# Patient Record
Sex: Female | Born: 1970 | Race: White | Hispanic: No | Marital: Married | Smoking: Never smoker
Health system: Southern US, Community
[De-identification: ages and names within clinical notes are randomized; demographics above are authoritative.]

## PROBLEM LIST (undated history)

## (undated) DIAGNOSIS — E119 Type 2 diabetes mellitus without complications: Secondary | ICD-10-CM

## (undated) HISTORY — PX: TUBAL LIGATION: SHX77

## (undated) HISTORY — PX: ENDOMETRIAL BIOPSY: SHX622

---

## 2008-11-24 ENCOUNTER — Emergency Department: Payer: Self-pay | Admitting: Emergency Medicine

## 2009-06-14 ENCOUNTER — Emergency Department: Payer: Self-pay | Admitting: Emergency Medicine

## 2010-07-18 ENCOUNTER — Emergency Department: Payer: Self-pay | Admitting: Unknown Physician Specialty

## 2011-07-11 ENCOUNTER — Emergency Department: Payer: Self-pay | Admitting: Unknown Physician Specialty

## 2011-07-11 LAB — URINALYSIS, COMPLETE
Blood: NEGATIVE
Glucose,UR: NEGATIVE mg/dL (ref 0–75)
Leukocyte Esterase: NEGATIVE
Protein: NEGATIVE
RBC,UR: 1 /HPF (ref 0–5)
Specific Gravity: 1.019 (ref 1.003–1.030)
WBC UR: 2 /HPF (ref 0–5)

## 2011-07-11 LAB — CBC
HGB: 12.6 g/dL (ref 12.0–16.0)
MCH: 29.7 pg (ref 26.0–34.0)
RBC: 4.26 10*6/uL (ref 3.80–5.20)
RDW: 12 % (ref 11.5–14.5)
WBC: 9.8 10*3/uL (ref 3.6–11.0)

## 2011-07-11 LAB — COMPREHENSIVE METABOLIC PANEL
Albumin: 3.7 g/dL (ref 3.4–5.0)
Anion Gap: 7 (ref 7–16)
BUN: 12 mg/dL (ref 7–18)
Calcium, Total: 8.3 mg/dL — ABNORMAL LOW (ref 8.5–10.1)
Chloride: 108 mmol/L — ABNORMAL HIGH (ref 98–107)
Co2: 28 mmol/L (ref 21–32)
Creatinine: 0.64 mg/dL (ref 0.60–1.30)
EGFR (Non-African Amer.): >60
Glucose: 89 mg/dL (ref 65–99)
Osmolality: 284 (ref 275–301)
SGPT (ALT): 50 U/L
Sodium: 143 mmol/L (ref 136–145)
Total Protein: 7.4 g/dL (ref 6.4–8.2)

## 2011-07-11 LAB — LIPASE, BLOOD: Lipase: 84 U/L (ref 73–393)

## 2011-08-02 ENCOUNTER — Emergency Department: Payer: Self-pay | Admitting: Emergency Medicine

## 2011-08-02 LAB — URINALYSIS, COMPLETE
Bacteria: NONE SEEN
Bilirubin,UR: NEGATIVE
Blood: NEGATIVE
Ketone: NEGATIVE
Leukocyte Esterase: NEGATIVE
Nitrite: NEGATIVE
Ph: 8 (ref 4.5–8.0)
Protein: NEGATIVE
WBC UR: 1 /HPF (ref 0–5)

## 2012-12-14 ENCOUNTER — Emergency Department: Payer: Self-pay | Admitting: Emergency Medicine

## 2013-08-10 ENCOUNTER — Emergency Department: Payer: Self-pay | Admitting: Emergency Medicine

## 2013-09-23 ENCOUNTER — Emergency Department: Payer: Self-pay | Admitting: Emergency Medicine

## 2013-12-07 ENCOUNTER — Emergency Department: Payer: Self-pay | Admitting: Internal Medicine

## 2014-02-24 ENCOUNTER — Ambulatory Visit: Payer: Self-pay

## 2014-12-02 ENCOUNTER — Emergency Department
Admission: EM | Admit: 2014-12-02 | Discharge: 2014-12-02 | Disposition: A | Discharge status: Home / Self Care | Payer: Self-pay | Attending: Emergency Medicine | Admitting: Emergency Medicine

## 2014-12-02 ENCOUNTER — Encounter: Payer: Self-pay | Admitting: Emergency Medicine

## 2014-12-02 DIAGNOSIS — G43009 Migraine without aura, not intractable, without status migrainosus: Secondary | ICD-10-CM

## 2014-12-02 DIAGNOSIS — M542 Cervicalgia: Secondary | ICD-10-CM | POA: Insufficient documentation

## 2014-12-02 DIAGNOSIS — G43909 Migraine, unspecified, not intractable, without status migrainosus: Secondary | ICD-10-CM | POA: Insufficient documentation

## 2014-12-02 MED ORDER — METOCLOPRAMIDE HCL 5 MG/ML IJ SOLN
10.0000 mg | Freq: Once | INTRAMUSCULAR | Status: AC
  Administered 2014-12-02: 10 mg via INTRAVENOUS
  Filled 2014-12-02 (×2): qty 2

## 2014-12-02 MED ORDER — SODIUM CHLORIDE 0.9 % IV BOLUS (SEPSIS)
1000.0000 mL | Freq: Once | INTRAVENOUS | Status: AC
  Administered 2014-12-02: 1000 mL via INTRAVENOUS

## 2014-12-02 MED ORDER — BUTALBITAL-APAP-CAFFEINE 50-325-40 MG PO TABS: 1.0000 | ORAL_TABLET | Freq: Four times a day (QID) | ORAL | Status: AC | PRN

## 2014-12-02 MED ORDER — DIPHENHYDRAMINE HCL 50 MG/ML IJ SOLN
50.0000 mg | Freq: Once | INTRAMUSCULAR | Status: AC
  Administered 2014-12-02: 50 mg via INTRAVENOUS
  Filled 2014-12-02: qty 1

## 2014-12-02 MED ORDER — KETOROLAC TROMETHAMINE 30 MG/ML IJ SOLN
30.0000 mg | Freq: Once | INTRAMUSCULAR | Status: AC
  Administered 2014-12-02: 30 mg via INTRAVENOUS
  Filled 2014-12-02: qty 1

## 2014-12-02 NOTE — ED Notes (Signed)
Bad headache.  C/O nausea and neck pain.  Onset of symptoms this morning..  States has history of headaches and migraines but states this headache is different.

## 2014-12-02 NOTE — Discharge Instructions (Signed)
As we discussed please return to the emergency department for any worsening headache, confusion, slurred speech, weakness or numbness of any arm or leg, or any other symptom personally concerning to your self.   Migraine Headache A migraine headache is an intense, throbbing pain on one or both sides of your head. A migraine can last for 30 minutes to several hours. CAUSES  The exact cause of a migraine headache is not always known. However, a migraine may be caused when nerves in the brain become irritated and release chemicals that cause inflammation. This causes pain. Certain things may also trigger migraines, such as:  Alcohol.  Smoking.  Stress.  Menstruation.  Aged cheeses.  Foods or drinks that contain nitrates, glutamate, aspartame, or tyramine.  Lack of sleep.  Chocolate.  Caffeine.  Hunger.  Physical exertion.  Fatigue.  Medicines used to treat chest pain (nitroglycerine), birth control pills, estrogen, and some blood pressure medicines. SIGNS AND SYMPTOMS  Pain on one or both sides of your head.  Pulsating or throbbing pain.  Severe pain that prevents daily activities.  Pain that is aggravated by any physical activity.  Nausea, vomiting, or both.  Dizziness.  Pain with exposure to bright lights, loud noises, or activity.  General sensitivity to bright lights, loud noises, or smells. Before you get a migraine, you may get warning signs that a migraine is coming (aura). An aura may include:  Seeing flashing lights.  Seeing bright spots, halos, or zigzag lines.  Having tunnel vision or blurred vision.  Having feelings of numbness or tingling.  Having trouble talking.  Having muscle weakness. DIAGNOSIS  A migraine headache is often diagnosed based on:  Symptoms.  Physical exam.  A CT scan or MRI of your head. These imaging tests cannot diagnose migraines, but they can help rule out other causes of headaches. TREATMENT Medicines may be  given for pain and nausea. Medicines can also be given to help prevent recurrent migraines.  HOME CARE INSTRUCTIONS  Only take over-the-counter or prescription medicines for pain or discomfort as directed by your health care provider. The use of long-term narcotics is not recommended.  Lie down in a dark, quiet room when you have a migraine.  Keep a journal to find out what may trigger your migraine headaches. For example, write down:  What you eat and drink.  How much sleep you get.  Any change to your diet or medicines.  Limit alcohol consumption.  Quit smoking if you smoke.  Get 7-9 hours of sleep, or as recommended by your health care provider.  Limit stress.  Keep lights dim if bright lights bother you and make your migraines worse. SEEK IMMEDIATE MEDICAL CARE IF:   Your migraine becomes severe.  You have a fever.  You have a stiff neck.  You have vision loss.  You have muscular weakness or loss of muscle control.  You start losing your balance or have trouble walking.  You feel faint or pass out.  You have severe symptoms that are different from your first symptoms. MAKE SURE YOU:   Understand these instructions.  Will watch your condition.  Will get help right away if you are not doing well or get worse.   This information is not intended to replace advice given to you by your health care provider. Make sure you discuss any questions you have with your health care provider.   Document Released: 01/15/2005 Document Revised: 02/05/2014 Document Reviewed: 09/22/2012 Elsevier Interactive Patient Education [employer]2016 [employer] Inc.

## 2014-12-02 NOTE — ED Provider Notes (Signed)
[facility] Emergency Department Provider Note  Time seen: 2:21 PM  I have reviewed the triage vital signs and the nursing notes.   HISTORY  Chief Complaint Headache    HPI Margaret Olson is a 44 y.o. female with a past medical history migraine headaches who presents the emergency department with a headache. According to the patient she began with a mild dull headache around 7:30 this morning. She states that it has progressively worsened, and is now one of the worst headache she has ever had. She states she's had to go to the emergency department before for a headache. She states this headache feels similar to that one. Denies any weakness or numbness. She does state some neck discomfort today, but Denies any neck stiffness. Denies any fever.Describes the headache as severe currently globally in location, dull aching in quality. Worse with bright lights or loud noises.     History reviewed. No pertinent past medical history.  There are no active problems to display for this patient.   History reviewed. No pertinent past surgical history.  No current outpatient prescriptions on file.  Allergies Review of patient's allergies indicates no known allergies.  No family history on file.  Social History Social History  Substance Use Topics  . Smoking status: Never Smoker   . Smokeless tobacco: Never Used  . Alcohol Use: No    Review of Systems Constitutional: Negative for fever. Eyes: Negative for visual changes. Cardiovascular: Negative for chest pain. Respiratory: Negative for shortness of breath. Gastrointestinal: Negative for abdominal pain. Some nausea but denies vomiting. Musculoskeletal: Mild neck discomfort. Neurological: Significant headache, denies focal weakness or numbness. 10-point ROS otherwise negative.  ____________________________________________   PHYSICAL EXAM:  VITAL SIGNS: ED Triage Vitals  Enc Vitals Group     BP  12/02/14 1342 115/87 mmHg     Pulse Rate 12/02/14 1342 70     Resp 12/02/14 1342 18     Temp 12/02/14 1342 98.2 F (36.8 C)     Temp Source 12/02/14 1342 Oral     SpO2 12/02/14 1342 98 %     Weight 12/02/14 1342 170 lb (77.111 kg)     Height 12/02/14 1342  (1.727 m)     Head Cir --      Peak Flow --      Pain Score 12/02/14 1344 10     Pain Loc --      Pain Edu? --      Excl. in GC? --     Constitutional: Alert and oriented. Tearful Eyes: Normal exam, mild photophobia elicited on exam. PERRL. ENT   Head: Normocephalic and atraumatic.   Mouth/Throat: Mucous membranes are moist. Cardiovascular: Normal rate, regular rhythm. No murmur Respiratory: Normal respiratory effort without tachypnea nor retractions. Breath sounds are clear Gastrointestinal: Soft and nontender. No distention.  Musculoskeletal: Nontender with normal range of motion in all extremities. Normal neck flexion, no stiffness or meningismus. Neurologic:  Normal speech and language. No gross focal neurologic deficits. Equal grip strengths. No pronator drift. Skin:  Skin is warm, dry and intact.  Psychiatric: Mood and affect are normal. Speech and behavior are normal.   ____________________________________________   INITIAL IMPRESSION / ASSESSMENT AND PLAN / ED COURSE  Pertinent labs & imaging results that were available during my care of the patient were reviewed by me and considered in my medical decision making (see chart for details).  Patient with a significant headache, appears to be most consistent with a  migraine headache. States progressive onset denies fever. I discussed with the patient the pros and cons of proceeding with a CT scan of the head. She states she would rather just treat headache and see if it resolves. We will closely monitor the patient in the emergency department we'll treating a headache with Reglan, Benadryl, Toradol, normal saline.  Headache much improved. Patient states 2/10  currently. We'll discharge home with Fioricet, and primary care follow-up. I discussed return precautions with the patient.  ____________________________________________   FINAL CLINICAL IMPRESSION(S) / ED DIAGNOSES  Migraine headache   Minna AntisKevin Rio Kidane, MD 12/02/14 [phone]

## 2015-02-10 ENCOUNTER — Emergency Department
Admission: EM | Admit: 2015-02-10 | Discharge: 2015-02-10 | Disposition: A | Discharge status: Home / Self Care | Payer: Self-pay | Attending: Emergency Medicine | Admitting: Emergency Medicine

## 2015-02-10 ENCOUNTER — Encounter: Payer: Self-pay | Admitting: Medical Oncology

## 2015-02-10 ENCOUNTER — Emergency Department: Payer: Self-pay

## 2015-02-10 DIAGNOSIS — R079 Chest pain, unspecified: Secondary | ICD-10-CM | POA: Insufficient documentation

## 2015-02-10 DIAGNOSIS — R0602 Shortness of breath: Secondary | ICD-10-CM | POA: Insufficient documentation

## 2015-02-10 LAB — CBC
HCT: 37.9 % (ref 35.0–47.0)
Hemoglobin: 12.6 g/dL (ref 12.0–16.0)
MCH: 28 pg (ref 26.0–34.0)
MCHC: 33.2 g/dL (ref 32.0–36.0)
MCV: 84.3 fL (ref 80.0–100.0)
Platelets: 249 10*3/uL (ref 150–440)
RBC: 4.49 MIL/uL (ref 3.80–5.20)
RDW: 12.7 % (ref 11.5–14.5)
WBC: 13.3 10*3/uL — ABNORMAL HIGH (ref 3.6–11.0)

## 2015-02-10 LAB — BASIC METABOLIC PANEL
ANION GAP: 3 — AB (ref 5–15)
BUN: 12 mg/dL (ref 6–20)
CALCIUM: 8.8 mg/dL — AB (ref 8.9–10.3)
CO2: 30 mmol/L (ref 22–32)
Chloride: 105 mmol/L (ref 101–111)
Creatinine, Ser: 0.57 mg/dL (ref 0.44–1.00)
GFR calc Af Amer: >60 mL/min (ref >60)
GLUCOSE: 118 mg/dL — AB (ref 65–99)
Potassium: 4.1 mmol/L (ref 3.5–5.1)
Sodium: 138 mmol/L (ref 135–145)

## 2015-02-10 LAB — TROPONIN I

## 2015-02-10 MED ORDER — CARISOPRODOL 350 MG PO TABS: 350.0000 mg | ORAL_TABLET | Freq: Three times a day (TID) | ORAL | Status: DC | PRN

## 2015-02-10 NOTE — ED Provider Notes (Signed)
[facility] Emergency Department Provider Note  ____________________________________________  Time seen: Approximately 1:20 PM  I have reviewed the triage vital signs and the nursing notes.   HISTORY  Chief Complaint Chest Pain and Shortness of Breath    HPI Margaret Olson is a 45 y.o. female without any chronic medical issues was presenting to the emergency department today for chest pain for the past day. She says the pain is right-sided and constant and moderate to severe. It is worsened with movement as well as deep breathing. She says that it is associated with a dry cough which also makes the cough worse. She says that the cough and the chest pain started around the same time. She denies any nausea vomiting or diaphoresis. No diarrhea. Has a sick contact of her daughter who also has an upper respiratory infection. Denies any fevers at home. Denies smoking. Says that the pain started yesterday and has been constant. She says "it feels like something is pulling."Patient denies being on birth control or any hormone supplementation. Denies any cardiac disease in the anyone under 30 years old and her family. No history of blood clot.   History reviewed. No pertinent past medical history.  There are no active problems to display for this patient.   History reviewed. No pertinent past surgical history.  Current Outpatient Rx  Name  Route  Sig  Dispense  Refill  . butalbital-acetaminophen-caffeine (FIORICET) 50-325-40 MG tablet   Oral   Take 1-2 tablets by mouth every 6 (six) hours as needed for headache.   20 tablet   0     Allergies Review of patient's allergies indicates no known allergies.  No family history on file.  Social History Social History  Substance Use Topics  . Smoking status: Never Smoker   . Smokeless tobacco: Never Used  . Alcohol Use: No    Review of Systems Constitutional: No fever/chills Eyes: No visual changes. ENT: No  sore throat. Cardiovascular: As above  Respiratory: Some shortness of breath when the pain becomes severe. Gastrointestinal: No abdominal pain.  No nausea, no vomiting.  No diarrhea.  No constipation. Genitourinary: Negative for dysuria. Musculoskeletal: Negative for back pain. Skin: Negative for rash. Neurological: Negative for headaches, focal weakness or numbness.  10-point ROS otherwise negative.  ____________________________________________   PHYSICAL EXAM:  VITAL SIGNS: ED Triage Vitals  Enc Vitals Group     BP 02/10/15 1022 157/67 mmHg     Pulse Rate 02/10/15 1022 79     Resp 02/10/15 1022 20     Temp 02/10/15 1022 97.5 F (36.4 C)     Temp src --      SpO2 02/10/15 1022 100 %     Weight 02/10/15 1021 180 lb (81.647 kg)     Height 02/10/15 1021 5\' 8"  (1.727 m)     Head Cir --      Peak Flow --      Pain Score 02/10/15 1024 8     Pain Loc --      Pain Edu? --      Excl. in GC? --     Constitutional: Alert and oriented. Well appearing and in no acute distress. Eyes: Conjunctivae are normal. PERRL. EOMI. Head: Atraumatic. Nose: No congestion/rhinnorhea. Mouth/Throat: Mucous membranes are moist.  Oropharynx non-erythematous. Neck: No stridor.   Cardiovascular: Normal rate, regular rhythm. Grossly normal heart sounds.  Good peripheral circulation. Reproducible chest pain to the right side of the chest as well as to  the right thoracic back. There is no crepitus or deformity or ecchymosis. Bilateral and equal radial as well as dorsalis pedis pulses. Respiratory: Normal respiratory effort.  No retractions. Lungs CTAB. Gastrointestinal: Soft and nontender. No distention. No abdominal bruits. No CVA tenderness. Musculoskeletal: No lower extremity tenderness nor edema.  No joint effusions. Neurologic:  Normal speech and language. No gross focal neurologic deficits are appreciated. No gait instability. Skin:  Skin is warm, dry and intact. No rash noted. Psychiatric: Mood  and affect are normal. Speech and behavior are normal.  ____________________________________________   LABS (all labs ordered are listed, but only abnormal results are displayed)  Labs Reviewed  BASIC METABOLIC PANEL - Abnormal; Notable for the following:    Glucose, Bld 118 (*)    Calcium 8.8 (*)    Anion gap 3 (*)    All other components within normal limits  CBC - Abnormal; Notable for the following:    WBC 13.3 (*)    All other components within normal limits  TROPONIN I   ____________________________________________  EKG  ED ECG REPORT I, Arelia LongestSchaevitz,  Kalan Rinn M, the attending physician, personally viewed and interpreted this ECG.   Date: 02/10/2015  EKG Time: 1021  Rate: 80  Rhythm: normal EKG, normal sinus rhythm  Axis: Normal  Intervals:none  ST&T Change: No ST segment elevation or depression. No abnormal T-wave inversion.  ____________________________________________  RADIOLOGY  No active disease on chest x-ray. ____________________________________________   PROCEDURES    ____________________________________________   INITIAL IMPRESSION / ASSESSMENT AND PLAN / ED COURSE  Pertinent labs & imaging results that were available during my care of the patient were reviewed by me and considered in my medical decision making (see chart for details).  ----------------------------------------- 1:33 PM on 02/10/2015 -----------------------------------------  Perc negative. Heart score of 0. History and physical consistent with chest wall pain. Extremely low probability this is cardiac disease. Patient with cough and chest pain which is reproducible palpation. We'll give muscle relaxer as prescription. Advised patient to use ibuprofen as needed as well as muscle cream such as Aspercreme, icy hot or BenGay. Patient understands the plan is one to comply. Will follow with [facility] where she is seen as a primary care  patient. ____________________________________________   FINAL CLINICAL IMPRESSION(S) / ED DIAGNOSES  Chest pain.    Myrna Blazeravid Matthew Addie Cederberg, MD 02/10/15 [phone]

## 2015-02-10 NOTE — ED Notes (Signed)
Patient given information regarding the medication management clinic.

## 2015-02-10 NOTE — ED Notes (Signed)
Pt reports she began having rt sided chest pain yesterday, pain is sharp stabbing pain that is constant. Pt reports feeling sob with pain.

## 2016-01-10 ENCOUNTER — Emergency Department: Payer: Self-pay

## 2016-01-10 ENCOUNTER — Emergency Department
Admission: EM | Admit: 2016-01-10 | Discharge: 2016-01-10 | Disposition: A | Discharge status: Home / Self Care | Payer: Self-pay | Attending: Emergency Medicine | Admitting: Emergency Medicine

## 2016-01-10 ENCOUNTER — Encounter: Payer: Self-pay | Admitting: Emergency Medicine

## 2016-01-10 DIAGNOSIS — Y929 Unspecified place or not applicable: Secondary | ICD-10-CM | POA: Insufficient documentation

## 2016-01-10 DIAGNOSIS — Y990 Civilian activity done for income or pay: Secondary | ICD-10-CM | POA: Insufficient documentation

## 2016-01-10 DIAGNOSIS — M5441 Lumbago with sciatica, right side: Secondary | ICD-10-CM

## 2016-01-10 DIAGNOSIS — X500XXA Overexertion from strenuous movement or load, initial encounter: Secondary | ICD-10-CM | POA: Insufficient documentation

## 2016-01-10 DIAGNOSIS — Y9389 Activity, other specified: Secondary | ICD-10-CM | POA: Insufficient documentation

## 2016-01-10 LAB — URINALYSIS, COMPLETE (UACMP) WITH MICROSCOPIC
Bilirubin Urine: NEGATIVE
Glucose, UA: NEGATIVE mg/dL
Hgb urine dipstick: NEGATIVE
Ketones, ur: NEGATIVE mg/dL
Nitrite: NEGATIVE
Protein, ur: 30 mg/dL — AB
Specific Gravity, Urine: 1.021 (ref 1.005–1.030)
pH: 5 (ref 5.0–8.0)

## 2016-01-10 LAB — PREGNANCY, URINE: Preg Test, Ur: NEGATIVE

## 2016-01-10 MED ORDER — MELOXICAM 7.5 MG PO TABS: 7.5000 mg | ORAL_TABLET | Freq: Every day | ORAL | 2 refills | Status: AC

## 2016-01-10 MED ORDER — PREDNISONE 10 MG (21) PO TBPK: 10.0000 mg | ORAL_TABLET | Freq: Every day | ORAL | 0 refills | Status: DC

## 2016-01-10 NOTE — ED Provider Notes (Signed)
[facility] Emergency Department Provider Note ____________________________________________  Time seen: Approximately 10:46 AM  I have reviewed the triage vital signs and the nursing notes.   HISTORY  Chief Complaint Back Pain    HPI Margaret Olson is a 45 y.o. female with a history of chronic back pain presents with 6 out of 10 right sided low back pain with radiculopathy for the past week. Patient states that pain is worsened with sitting. She has been moving furniture at work. She has taken Aleve, which has partially relieved her symptoms. She denies fever, dysuria, increased urinary frequency and bowel/ bladder incontinence. She denies a history of hypertension or diabetes. She denies chest pain, abdominal pain or shortness of breath. Patient does not currently smoke and has no personal history of malignancy.  History reviewed. No pertinent past medical history.  There are no active problems to display for this patient.   History reviewed. No pertinent surgical history.  Prior to Admission medications   Medication Sig Start Date End Date Taking? Authorizing Provider  carisoprodol (SOMA) 350 MG tablet Take 1 tablet (350 mg total) by mouth 3 (three) times daily as needed for muscle spasms. 02/10/15   Myrna Blazeravid Matthew Schaevitz, MD  meloxicam (MOBIC) 7.5 MG tablet Take 1 tablet (7.5 mg total) by mouth daily. 01/10/16 01/09/17  Orvil FeilJaclyn M Tiquan Bouch, PA-C  predniSONE (STERAPRED UNI-PAK 21 TAB) 10 MG (21) TBPK tablet Take 1 tablet (10 mg total) by mouth daily. Take 6 tablets on first day, 5 tablets on second day, 4 tablets on third day, 3 tablets on fourth day, 2 tablets on five, 1 tablet on day six. 01/10/16   Orvil FeilJaclyn M Kyera Felan, PA-C    Allergies Patient has no known allergies.  No family history on file.  Social History Social History  Substance Use Topics  . Smoking status: Never Smoker  . Smokeless tobacco: Never Used  . Alcohol use No    Review of  Systems Constitutional: No recent illness. Cardiovascular: Denies chest pain or palpitations. Respiratory: Denies shortness of breath. Musculoskeletal: Pain in right low back. Skin: Negative for rash, wound, lesion. Neurological: Negative for focal weakness or numbness.  ____________________________________________   PHYSICAL EXAM:  VITAL SIGNS: ED Triage Vitals  Enc Vitals Group     BP 01/10/16 0909 128/70     Pulse Rate 01/10/16 0909 71     Resp 01/10/16 0909 18     Temp 01/10/16 0909 97.8 F (36.6 C)     Temp Source 01/10/16 0909 Oral     SpO2 01/10/16 0909 99 %     Weight 01/10/16 0910 180 lb (81.6 kg)     Height 01/10/16 0910 5\' 8"  (1.727 m)     Head Circumference --      Peak Flow --      Pain Score 01/10/16 0910 6     Pain Loc --      Pain Edu? --      Excl. in GC? --     Constitutional: Alert and oriented. Well appearing and in no acute distress. Head: Atraumatic. Respiratory: Normal respiratory effort.   Musculoskeletal: Patient has 5/5 strength in the upper and lower extremities bilaterally. Full range of motion at the shoulder, elbow and wrist bilaterally. Full range of motion at the hip, knee, and ankle bilaterally. No changes in gait.  She has no tenderness along the length of the spine. Patient's right sided low back pain is intensified with extension at the spine. Positive straight leg  raise, right. Neurologic: Normal speech and language. She has right-sided lower extremity radiculopathy along S1, S2 and S3 dermatomes. Cranial nerves: 2-10 normal as tested. Cerebellar: Finger-nose-finger WNL, heel to shin WNL. Sensorimotor: No pronator drift, clonus, sensory loss or abnormal reflexes. Vision: No visual field deficts noted to confrontation.  Speech: No dysarthria or expressive aphasia.  Skin:  Skin is warm, dry and intact. Atraumatic. Psychiatric: Mood and affect are normal. Speech and behavior are normal.  ____________________________________________    LABS (all labs ordered are listed, but only abnormal results are displayed)  Labs Reviewed  URINALYSIS, COMPLETE (UACMP) WITH MICROSCOPIC - Abnormal; Notable for the following:       Result Value   Color, Urine YELLOW (*)    APPearance HAZY (*)    Protein, ur 30 (*)    Leukocytes, UA SMALL (*)    Bacteria, UA RARE (*)    Squamous Epithelial / LPF 6-30 (*)    All other components within normal limits  PREGNANCY, URINE   ____________________________________________  RADIOLOGY  Geraldo PitterI, Willamae Demby M Aishah Teffeteller, personally viewed and evaluated these images (plain radiographs) as part of my medical decision making, as well as reviewing the written report by the radiologist.  DG Lumbar: No acute fractures or dislocations visualized.  ____________________________________________   PROCEDURES  Procedure(s) performed: None   ____________________________________________   INITIAL IMPRESSION / ASSESSMENT AND PLAN / ED COURSE  Clinical Course     Pertinent labs & imaging results that were available during my care of the patient were reviewed by me and considered in my medical decision making (see chart for details).  Assessment and plan: Differential diagnosis includes lumbar strain versus UTI. Patient has experienced right sided low back pain with radiculopathy along S1, S2, and S3 dermatomes for the past week. She has a history of chronic low back pain. No reports of increased urinary frequency or dysuria. Urinalysis conducted in the emergency department shows leukocytes and bacteria. I will not treat asymptomatic bacteriuria at this time, as patient is not pregnant or symptomatic. Patient was treated with a 6 day course of prednisone. She was advised to use Mobic for pain and inflammation after prednisone completion. A referral was made to the orthopedist on-call, Dr. Joice LoftsPoggi. She was advised to make an appointment if back pain persist after one week. Vital signs are reassuring at this time. All  patient questions were answered. ____________________________________________   FINAL CLINICAL IMPRESSION(S) / ED DIAGNOSES  Final diagnoses:  Acute right-sided low back pain with right-sided sciatica       Orvil FeilJaclyn M Nema Oatley, PA-C 01/10/16 1252    Jennye MoccasinBrian S Quigley, MD 01/10/16 [phone]

## 2016-01-10 NOTE — ED Triage Notes (Signed)
Patient presents to the ED with back pain that has been increasing over the past week.  Patient reports she has history of back pain and that she lifts heavy furniture at work.  Patient states pain is to lower back.  Patient is in no obvious distress at this time.  Ambulatory to triage.

## 2016-07-07 ENCOUNTER — Emergency Department
Admission: EM | Admit: 2016-07-07 | Discharge: 2016-07-07 | Disposition: A | Discharge status: Home / Self Care | Payer: Self-pay | Attending: Emergency Medicine | Admitting: Emergency Medicine

## 2016-07-07 ENCOUNTER — Encounter: Payer: Self-pay | Admitting: Emergency Medicine

## 2016-07-07 DIAGNOSIS — Z79899 Other long term (current) drug therapy: Secondary | ICD-10-CM | POA: Insufficient documentation

## 2016-07-07 DIAGNOSIS — R300 Dysuria: Secondary | ICD-10-CM | POA: Insufficient documentation

## 2016-07-07 DIAGNOSIS — E119 Type 2 diabetes mellitus without complications: Secondary | ICD-10-CM | POA: Insufficient documentation

## 2016-07-07 DIAGNOSIS — H1089 Other conjunctivitis: Secondary | ICD-10-CM

## 2016-07-07 DIAGNOSIS — H109 Unspecified conjunctivitis: Secondary | ICD-10-CM | POA: Insufficient documentation

## 2016-07-07 HISTORY — DX: Type 2 diabetes mellitus without complications: E11.9

## 2016-07-07 LAB — URINALYSIS, COMPLETE (UACMP) WITH MICROSCOPIC
BACTERIA UA: NONE SEEN
BILIRUBIN URINE: NEGATIVE
Glucose, UA: NEGATIVE mg/dL
HGB URINE DIPSTICK: NEGATIVE
Ketones, ur: NEGATIVE mg/dL
LEUKOCYTES UA: NEGATIVE
NITRITE: NEGATIVE
PROTEIN: NEGATIVE mg/dL
RBC / HPF: NONE SEEN RBC/hpf (ref 0–5)
Specific Gravity, Urine: 1.013 (ref 1.005–1.030)
pH: 5 (ref 5.0–8.0)

## 2016-07-07 MED ORDER — FLUORESCEIN SODIUM 0.6 MG OP STRP
1.0000 | ORAL_STRIP | Freq: Once | OPHTHALMIC | Status: DC
  Filled 2016-07-07: qty 1

## 2016-07-07 MED ORDER — KETOROLAC TROMETHAMINE 0.5 % OP SOLN: 1.0000 [drp] | Freq: Four times a day (QID) | OPHTHALMIC | 0 refills | Status: DC

## 2016-07-07 MED ORDER — ERYTHROMYCIN 5 MG/GM OP OINT
1.0000 "application " | TOPICAL_OINTMENT | Freq: Once | OPHTHALMIC | Status: AC
  Administered 2016-07-07: 1 via OPHTHALMIC
  Filled 2016-07-07: qty 1

## 2016-07-07 MED ORDER — TETRACAINE HCL 0.5 % OP SOLN
2.0000 [drp] | Freq: Once | OPHTHALMIC | Status: DC
  Filled 2016-07-07: qty 2

## 2016-07-07 NOTE — ED Provider Notes (Signed)
[facility] Emergency Department Provider Note ____________________________________________  Time seen: 1507  I have reviewed the triage vital signs and the nursing notes.  HISTORY  Chief Complaint  Eye Pain and Dysuria  HPI Margaret Olson is a 46 y.o. female presents to the ED with a one day complaint of right eye pain and discomfort. She denies any visual disturbance, floaters, tunnel vision, or eye injury. She also denies any nausea, vomiting, or headache.She wears extended wear contact lenses and reports a FBS after removing the right lens. She admits to sleeping in the lens overnight. She has been unable to remove the lens since reapplying it this morning. She reports irritation, tearing, burning, and light sensitivity to the eye. She denies matting, crusting, or purulent drainage.   She has an unrelated complaint of dysuria for the past day as well. She denies hematuria, frequency, or retention.   Past Medical History:  Diagnosis Date  . Diabetes mellitus without complication (HCC)     There are no active problems to display for this patient.  History reviewed. No pertinent surgical history.  Prior to Admission medications   Medication Sig Start Date End Date Taking? Authorizing Provider  carisoprodol (SOMA) 350 MG tablet Take 1 tablet (350 mg total) by mouth 3 (three) times daily as needed for muscle spasms. 02/10/15   Schaevitz, Myra Rudeavid Matthew, MD  ketorolac (ACULAR) 0.5 % ophthalmic solution Place 1 drop into the right eye 4 (four) times daily. 07/07/16   Anastasya Jewell, Charlesetta IvoryJenise V Bacon, PA-C  meloxicam (MOBIC) 7.5 MG tablet Take 1 tablet (7.5 mg total) by mouth daily. 01/10/16 01/09/17  Orvil FeilWoods, Jaclyn M, PA-C  predniSONE (STERAPRED UNI-PAK 21 TAB) 10 MG (21) TBPK tablet Take 1 tablet (10 mg total) by mouth daily. Take 6 tablets on first day, 5 tablets on second day, 4 tablets on third day, 3 tablets on fourth day, 2 tablets on five, 1 tablet on day six. 01/10/16    Orvil FeilWoods, Jaclyn M, PA-C    Allergies Patient has no known allergies.  No family history on file.  Social History Social History  Substance Use Topics  . Smoking status: Never Smoker  . Smokeless tobacco: Never Used  . Alcohol use No    Review of Systems  Constitutional: Negative for fever. Eyes: Negative for visual changes. Reports right eye pain as above ENT: Negative for sore throat. Cardiovascular: Negative for chest pain. Respiratory: Negative for shortness of breath. Gastrointestinal: Negative for abdominal pain, vomiting and diarrhea. GU: Reports dysuria Neurological: Negative for headaches, focal weakness or numbness. ____________________________________________  PHYSICAL EXAM:  VITAL SIGNS: ED Triage Vitals  Enc Vitals Group     BP 07/07/16 1408 105/83     Pulse Rate 07/07/16 1408 97     Resp 07/07/16 1408 18     Temp 07/07/16 1408 98.4 F (36.9 C)     Temp Source 07/07/16 1408 Oral     SpO2 07/07/16 1408 97 %     Weight 07/07/16 1409 198 lb (89.8 kg)     Height 07/07/16 1409 5\' 8"  (1.727 m)     Head Circumference --      Peak Flow --      Pain Score 07/07/16 1408 6     Pain Loc --      Pain Edu? --      Excl. in GC? --     Constitutional: Alert and oriented. Well appearing and in no distress. Head: Normocephalic and atraumatic. Eyes: Conjunctivae are normal  on the left and slightly erythematous on the right. Contact lens noted in the right eye. PERRL. Normal extraocular movements. Contact lens removed manually from the right eye. No fluorescein dye uptake.  Neck: Supple. No thyromegaly. Hematological/Lymphatic/Immunological: No preauricular lymphadenopathy. Cardiovascular: Normal rate, regular rhythm. Normal distal pulses. Respiratory: Normal respiratory effort. No wheezes/rales/rhonchi. Neurologic:  Normal gait without ataxia. Normal speech and language. No gross focal neurologic deficits are appreciated. Skin:  Skin is warm, dry and intact. No rash  noted. ____________________________________________  LABORATORY  Labs Reviewed  URINALYSIS, COMPLETE (UACMP) WITH MICROSCOPIC - Abnormal; Notable for the following:       Result Value   Color, Urine YELLOW (*)    APPearance CLEAR (*)    Squamous Epithelial / LPF 0-5 (*)    All other components within normal limits   ________________________________________________  PROCEDURES  Tetracaine ii gtts OD Erythromycin ophthalmic ointment - OD  Visual Acuity  Right Eye Distance:   > 20/200 uncorrected Left Eye Distance:   Bilateral Distance:   ____________________________________________  INITIAL IMPRESSION / ASSESSMENT AND PLAN / ED COURSE  Patient with a right eye conjunctivitis without evidence of a corneal abrasion. She may have sustained a small scratch greater than 18 hours ago, which is now resolved. Her symptoms are complicated by extended contact lens wear. She will be discharged with a prescription for ketorolac ophthalmic. She is advised to follow-up with [facility] or her optometrist for continued symptoms.  ____________________________________________  FINAL CLINICAL IMPRESSION(S) / ED DIAGNOSES  Final diagnoses:  Other conjunctivitis of right eye  Dysuria      Karmen Stabs, Charlesetta Ivory, PA-C 07/07/16 1645    Emily Filbert, MD 07/07/16 [phone]

## 2016-07-07 NOTE — ED Triage Notes (Signed)
R eye pain since yesterday Wears contacts and has in.

## 2016-07-07 NOTE — ED Notes (Signed)
See triage note  States she developed pain to right eye yesterday w/o trauma

## 2016-07-07 NOTE — ED Notes (Signed)
Pt verbalized understanding of discharge instructions. NAD at this time. 

## 2016-07-07 NOTE — Discharge Instructions (Signed)
Your eye exam did not reveal an scratch to the cornea or an infection. You have irritation which may have been caused by your contact lens. Use the eye drops as directed. Consider using OTC Clear Care to cleanse your contact lenses overnight. Follow-up with your provider or optometrist as needed.

## 2016-07-21 ENCOUNTER — Emergency Department
Admission: EM | Admit: 2016-07-21 | Discharge: 2016-07-21 | Disposition: A | Discharge status: Home / Self Care | Payer: Self-pay | Attending: Emergency Medicine | Admitting: Emergency Medicine

## 2016-07-21 ENCOUNTER — Emergency Department: Payer: Self-pay

## 2016-07-21 ENCOUNTER — Encounter: Payer: Self-pay | Admitting: *Deleted

## 2016-07-21 DIAGNOSIS — R0789 Other chest pain: Secondary | ICD-10-CM | POA: Insufficient documentation

## 2016-07-21 DIAGNOSIS — R52 Pain, unspecified: Secondary | ICD-10-CM

## 2016-07-21 DIAGNOSIS — E119 Type 2 diabetes mellitus without complications: Secondary | ICD-10-CM | POA: Insufficient documentation

## 2016-07-21 DIAGNOSIS — R079 Chest pain, unspecified: Secondary | ICD-10-CM

## 2016-07-21 LAB — BASIC METABOLIC PANEL
ANION GAP: 8 (ref 5–15)
BUN: 9 mg/dL (ref 6–20)
CALCIUM: 9.1 mg/dL (ref 8.9–10.3)
CO2: 24 mmol/L (ref 22–32)
Chloride: 104 mmol/L (ref 101–111)
Creatinine, Ser: 0.73 mg/dL (ref 0.44–1.00)
Glucose, Bld: 129 mg/dL — ABNORMAL HIGH (ref 65–99)
POTASSIUM: 3.9 mmol/L (ref 3.5–5.1)
Sodium: 136 mmol/L (ref 135–145)

## 2016-07-21 LAB — HEPATIC FUNCTION PANEL
ALT: 46 U/L (ref 14–54)
AST: 36 U/L (ref 15–41)
Albumin: 4.1 g/dL (ref 3.5–5.0)
Alkaline Phosphatase: 83 U/L (ref 38–126)
Bilirubin, Direct: <0.1 mg/dL — ABNORMAL LOW (ref 0.1–0.5)
TOTAL PROTEIN: 7.7 g/dL (ref 6.5–8.1)
Total Bilirubin: 0.6 mg/dL (ref 0.3–1.2)

## 2016-07-21 LAB — CBC
HEMATOCRIT: 37.9 % (ref 35.0–47.0)
HEMOGLOBIN: 13.2 g/dL (ref 12.0–16.0)
MCH: 29.3 pg (ref 26.0–34.0)
MCHC: 34.9 g/dL (ref 32.0–36.0)
MCV: 83.9 fL (ref 80.0–100.0)
Platelets: 288 10*3/uL (ref 150–440)
RBC: 4.52 MIL/uL (ref 3.80–5.20)
RDW: 12.7 % (ref 11.5–14.5)
WBC: 14.2 10*3/uL — ABNORMAL HIGH (ref 3.6–11.0)

## 2016-07-21 LAB — HCG, QUANTITATIVE, PREGNANCY: hCG, Beta Chain, Quant, S: 1 m[IU]/mL (ref <5)

## 2016-07-21 LAB — LIPASE, BLOOD: Lipase: 25 U/L (ref 11–51)

## 2016-07-21 LAB — URINALYSIS, COMPLETE (UACMP) WITH MICROSCOPIC
BILIRUBIN URINE: NEGATIVE
Glucose, UA: NEGATIVE mg/dL
HGB URINE DIPSTICK: NEGATIVE
KETONES UR: NEGATIVE mg/dL
LEUKOCYTES UA: NEGATIVE
NITRITE: NEGATIVE
PROTEIN: NEGATIVE mg/dL
RBC / HPF: NONE SEEN RBC/hpf (ref 0–5)
Specific Gravity, Urine: 1.003 — ABNORMAL LOW (ref 1.005–1.030)
pH: 6 (ref 5.0–8.0)

## 2016-07-21 LAB — TROPONIN I

## 2016-07-21 LAB — PREGNANCY, URINE: Preg Test, Ur: NEGATIVE

## 2016-07-21 MED ORDER — IOPAMIDOL (ISOVUE-370) INJECTION 76%
75.0000 mL | Freq: Once | INTRAVENOUS | Status: AC | PRN
  Administered 2016-07-21: 75 mL via INTRAVENOUS

## 2016-07-21 MED ORDER — FENTANYL CITRATE (PF) 100 MCG/2ML IJ SOLN
INTRAMUSCULAR | Status: AC
  Administered 2016-07-21: 75 ug via INTRAVENOUS
  Filled 2016-07-21: qty 2

## 2016-07-21 MED ORDER — FENTANYL CITRATE (PF) 100 MCG/2ML IJ SOLN
75.0000 ug | Freq: Once | INTRAMUSCULAR | Status: AC
  Administered 2016-07-21: 75 ug via INTRAVENOUS

## 2016-07-21 MED ORDER — KETOROLAC TROMETHAMINE 30 MG/ML IJ SOLN
15.0000 mg | Freq: Once | INTRAMUSCULAR | Status: AC
  Administered 2016-07-21: 15 mg via INTRAVENOUS

## 2016-07-21 MED ORDER — KETOROLAC TROMETHAMINE 30 MG/ML IJ SOLN
INTRAMUSCULAR | Status: AC
  Administered 2016-07-21: 15 mg via INTRAVENOUS
  Filled 2016-07-21: qty 1

## 2016-07-21 MED ORDER — HYDROCODONE-ACETAMINOPHEN 5-325 MG PO TABS
1.0000 | ORAL_TABLET | Freq: Once | ORAL | Status: DC
  Filled 2016-07-21: qty 1

## 2016-07-21 MED ORDER — ONDANSETRON HCL 4 MG/2ML IJ SOLN
4.0000 mg | Freq: Once | INTRAMUSCULAR | Status: AC
  Administered 2016-07-21: 4 mg via INTRAVENOUS
  Filled 2016-07-21: qty 2

## 2016-07-21 MED ORDER — HYDROCODONE-ACETAMINOPHEN 5-325 MG PO TABS: 1.0000 | ORAL_TABLET | Freq: Four times a day (QID) | ORAL | 0 refills | Status: DC | PRN

## 2016-07-21 NOTE — ED Provider Notes (Signed)
[facility] Emergency Department Provider Note  ____________________________________________   First MD Initiated Contact with Patient 07/21/16 0430     (approximate)  I have reviewed the triage vital signs and the nursing notes.   HISTORY  Chief Complaint Chest Pain   HPI Margaret MaclachlanKristine L Luevanos is a 46 y.o. female who self presents to the emergency department with right lateral chest pain that began last night while in bed. She said she felt in her usual state of health last night however woke up around 2 in the morning with severe right lower chest pain that felt like "something popped" she initially was able to get back to sleep and turned over onto her left side when the pain recurred. Pain is severe aching nonradiating. Worse with deep breaths and improved when not breathing hard. She is somewhat nauseated. She has had about 2 weeks of dysuria and urinary frequency. She does have some pain in her back. Has no history of abdominal surgeries.   Past Medical History:  Diagnosis Date  . Diabetes mellitus without complication (HCC)     There are no active problems to display for this patient.   History reviewed. No pertinent surgical history.  Prior to Admission medications   Medication Sig Start Date End Date Taking? Authorizing Provider  ketorolac (ACULAR) 0.5 % ophthalmic solution Place 1 drop into the right eye 4 (four) times daily. 07/07/16  Yes Menshew, Charlesetta IvoryJenise V Bacon, PA-C  carisoprodol (SOMA) 350 MG tablet Take 1 tablet (350 mg total) by mouth 3 (three) times daily as needed for muscle spasms. Patient not taking: Reported on 07/21/2016 02/10/15   Myrna BlazerSchaevitz, David Matthew, MD  HYDROcodone-acetaminophen (NORCO) 5-325 MG tablet Take 1 tablet by mouth every 6 (six) hours as needed for severe pain. 07/21/16   Merrily Brittleifenbark, Caelie [place], MD  meloxicam (MOBIC) 7.5 MG tablet Take 1 tablet (7.5 mg total) by mouth daily. Patient not taking: Reported on 07/21/2016 01/10/16  01/09/17  Orvil FeilWoods, Jaclyn M, PA-C  predniSONE (STERAPRED UNI-PAK 21 TAB) 10 MG (21) TBPK tablet Take 1 tablet (10 mg total) by mouth daily. Take 6 tablets on first day, 5 tablets on second day, 4 tablets on third day, 3 tablets on fourth day, 2 tablets on five, 1 tablet on day six. Patient not taking: Reported on 07/21/2016 01/10/16   Orvil FeilWoods, Jaclyn M, PA-C    Allergies Patient has no known allergies.  No family history on file.  Social History Social History  Substance Use Topics  . Smoking status: Never Smoker  . Smokeless tobacco: Never Used  . Alcohol use No    Review of Systems Constitutional: No fever/chills Eyes: No visual changes. ENT: No sore throat. Cardiovascular: Positive chest pain. Respiratory: Positive shortness of breath. Gastrointestinal: Positive abdominal pain.  Positive nausea, no vomiting.  No diarrhea.  No constipation. Genitourinary: Negative for dysuria. Musculoskeletal: Negative for back pain. Skin: Negative for rash. Neurological: Negative for headaches, focal weakness or numbness.   ____________________________________________   PHYSICAL EXAM:  VITAL SIGNS: ED Triage Vitals [07/21/16 0424]  Enc Vitals Group     BP 137/82     Pulse Rate 96     Resp 20     Temp 98.2 F (36.8 C)     Temp Source Oral     SpO2 99 %     Weight 198 lb (89.8 kg)     Height 5\' 8"  (1.727 m)     Head Circumference      Peak Flow  Pain Score 8     Pain Loc      Pain Edu?      Excl. in GC?     Constitutional: Alert and oriented 4 sitting up in bed leaning towards her left side tearful taking rapid shallow breaths uncomfortable appearing Eyes: PERRL EOMI. Head: Atraumatic. Nose: No congestion/rhinnorhea. Mouth/Throat: No trismus Neck: No stridor.   Cardiovascular: Normal rate, regular rhythm. Grossly normal heart sounds.  Good peripheral circulation. Respiratory: Increased respiratory effort was shallow breaths focally exquisitely tender over lower ribs on  the right side Gastrointestinal: Soft abdomen she is tender in her right upper quadrant no rebound no guarding no peritonitis Musculoskeletal: No lower extremity edema   Neurologic:  Normal speech and language. No gross focal neurologic deficits are appreciated. Skin:  Skin is warm, dry and intact. No rash noted. Psychiatric: Mood and affect are normal. Speech and behavior are normal.    ____________________________________________   DIFFERENTIAL includes but not limited to  Acute coronary syndrome, cholecystitis, biliary colic, pulmonary embolism, pleurisy, costochondritis   LABS (all labs ordered are listed, but only abnormal results are displayed)  Labs Reviewed  BASIC METABOLIC PANEL - Abnormal; Notable for the following:       Result Value   Glucose, Bld 129 (*)    All other components within normal limits  CBC - Abnormal; Notable for the following:    WBC 14.2 (*)    All other components within normal limits  HEPATIC FUNCTION PANEL - Abnormal; Notable for the following:    Bilirubin, Direct <0.1 (*)    All other components within normal limits  URINALYSIS, COMPLETE (UACMP) WITH MICROSCOPIC - Abnormal; Notable for the following:    Color, Urine STRAW (*)    APPearance CLEAR (*)    Specific Gravity, Urine 1.003 (*)    Bacteria, UA RARE (*)    Squamous Epithelial / LPF 0-5 (*)    All other components within normal limits  URINE CULTURE  TROPONIN I  LIPASE, BLOOD  HCG, QUANTITATIVE, PREGNANCY  PREGNANCY, URINE    No signs of acute ischemia and no signs of biliary obstruction __________________________________________  EKG  ED ECG REPORT I, Merrily Brittle, the attending physician, personally viewed and interpreted this ECG.  Date: 07/21/2016 Rate: 95 Rhythm: normal sinus rhythm QRS Axis: normal Intervals: normal ST/T Wave abnormalities: normal Narrative Interpretation: unremarkable  ____________________________________________  RADIOLOGY  CT scan of the  chest with no acute disease ultrasound of the right upper quadrant is within normal limits ____________________________________________   PROCEDURES  Procedure(s) performed: no  Procedures  Critical Care performed: no  Observation: no ____________________________________________   INITIAL IMPRESSION / ASSESSMENT AND PLAN / ED COURSE  Pertinent labs & imaging results that were available during my care of the patient were reviewed by me and considered in my medical decision making (see chart for details).  The patient arrives with pleuritic right-sided chest pain is exquisitely tender over her chest wall. EKG is reassuring and nonischemic. Differential is broad but in this lower chest upper abdomen does include biliary colic as well as pulmonary embolism. Right upper quadrant ultrasound was negative for stones and obstruction, although it was extremely difficult for her to have the test performed secondary to discomfort with ultrasound probe. CT scan of the chest is fortunately negative as well. At this point she most likely has costochondritis. I will prescribe her a short course of Vicodin in addition to NSAIDs and strict return precautions given. She is medically stable for outpatient  management.      ____________________________________________   FINAL CLINICAL IMPRESSION(S) / ED DIAGNOSES  Final diagnoses:  Pain  Nonspecific chest pain  Chest wall pain      NEW MEDICATIONS STARTED DURING THIS VISIT:  New Prescriptions   HYDROCODONE-ACETAMINOPHEN (NORCO) 5-325 MG TABLET    Take 1 tablet by mouth every 6 (six) hours as needed for severe pain.     Note:  This document was prepared using Dragon voice recognition software and may include unintentional dictation errors.     Merrily Brittle, MD 07/21/16 [id number]

## 2016-07-21 NOTE — ED Triage Notes (Signed)
Pt woke up with right sided chest pain tonight , pt reports shortness of breath, starting tonight,pt anxious in triage

## 2016-07-21 NOTE — Discharge Instructions (Addendum)
Please take 600 mg of ibuprofen 3 times a day for the next week for your chest pain and take Norco as needed for breakthrough pain. Follow-up with your primary care physician on Monday for recheck. Return to the emergency department sooner for any new or worsening symptoms such as worsening pain, if you cannot eat or drink, or for any other concerns.  It was a pleasure to take care of you today, and thank you for coming to our emergency department.  If you have any questions or concerns before leaving please ask the nurse to grab me and I'm more than happy to go through your aftercare instructions again.  If you were prescribed any opioid pain medication today such as Norco, Vicodin, Percocet, morphine, hydrocodone, or oxycodone please make sure you do not drive when you are taking this medication as it can alter your ability to drive safely.  If you have any concerns once you are home that you are not improving or are in fact getting worse before you can make it to your follow-up appointment, please do not hesitate to call [phone] and come back for further evaluation.  Merrily BrittleNeil Iven Earnhart MD  Results for orders placed or performed during the hospital encounter of 07/21/16  Basic metabolic panel  Result Value Ref Range   Sodium 136 135 - 145 mmol/L   Potassium 3.9 3.5 - 5.1 mmol/L   Chloride 104 101 - 111 mmol/L   CO2 24 22 - 32 mmol/L   Glucose, Bld 129 (H) 65 - 99 mg/dL   BUN 9 6 - 20 mg/dL   Creatinine, Ser 6.570.73 0.44 - 1.00 mg/dL   Calcium 9.1 8.9 - 84.610.3 mg/dL   GFR calc non Af Amer >60 >60 mL/min   GFR calc Af Amer >60 >60 mL/min   Anion gap 8 5 - 15  CBC  Result Value Ref Range   WBC 14.2 (H) 3.6 - 11.0 K/uL   RBC 4.52 3.80 - 5.20 MIL/uL   Hemoglobin 13.2 12.0 - 16.0 g/dL   HCT 96.237.9 95.235.0 - 84.147.0 %   MCV 83.9 80.0 - 100.0 fL   MCH 29.3 26.0 - 34.0 pg   MCHC 34.9 32.0 - 36.0 g/dL   RDW 32.412.7 40.111.5 - 02.714.5 %   Platelets 288 150 - 440 K/uL  Troponin I  Result Value Ref Range   Troponin I  <0.03 <0.03 ng/mL  Hepatic function panel  Result Value Ref Range   Total Protein 7.7 6.5 - 8.1 g/dL   Albumin 4.1 3.5 - 5.0 g/dL   AST 36 15 - 41 U/L   ALT 46 14 - 54 U/L   Alkaline Phosphatase 83 38 - 126 U/L   Total Bilirubin 0.6 0.3 - 1.2 mg/dL   Bilirubin, Direct <2.5<0.1 (L) 0.1 - 0.5 mg/dL   Indirect Bilirubin NOT CALCULATED 0.3 - 0.9 mg/dL  Lipase, blood  Result Value Ref Range   Lipase 25 11 - 51 U/L  hCG, quantitative, pregnancy  Result Value Ref Range   hCG, Beta Chain, Quant, S 1 <5 mIU/mL  Urinalysis, Complete w Microscopic  Result Value Ref Range   Color, Urine STRAW (A) YELLOW   APPearance CLEAR (A) CLEAR   Specific Gravity, Urine 1.003 (L) 1.005 - 1.030   pH 6.0 5.0 - 8.0   Glucose, UA NEGATIVE NEGATIVE mg/dL   Hgb urine dipstick NEGATIVE NEGATIVE   Bilirubin Urine NEGATIVE NEGATIVE   Ketones, ur NEGATIVE NEGATIVE mg/dL   Protein, ur NEGATIVE NEGATIVE  mg/dL   Nitrite NEGATIVE NEGATIVE   Leukocytes, UA NEGATIVE NEGATIVE   RBC / HPF NONE SEEN 0 - 5 RBC/hpf   WBC, UA 0-5 0 - 5 WBC/hpf   Bacteria, UA RARE (A) NONE SEEN   Squamous Epithelial / LPF 0-5 (A) NONE SEEN  Pregnancy, urine  Result Value Ref Range   Preg Test, Ur NEGATIVE NEGATIVE   Dg Chest 2 View  Result Date: 07/21/2016 CLINICAL DATA:  Right-sided chest pain.  Shortness of breath. EXAM: CHEST  2 VIEW COMPARISON:  02/10/2015 FINDINGS: The cardiomediastinal contours are normal. Linear subsegmental atelectasis in the lingula. Pulmonary vasculature is normal. No consolidation, pleural effusion, or pneumothorax. No acute osseous abnormalities are seen. IMPRESSION: Subsegmental atelectasis in the lingula. Otherwise no acute abnormality. Electronically Signed   By: Rubye Oaks M.D.   On: 07/21/2016 04:52   Ct Angio Chest Pe W/cm &/or Wo Cm  Result Date: 07/21/2016 CLINICAL DATA:  Right-sided chest pain.  Shortness of breath. EXAM: CT ANGIOGRAPHY CHEST WITH CONTRAST TECHNIQUE: Multidetector CT imaging of  the chest was performed using the standard protocol during bolus administration of intravenous contrast. Multiplanar CT image reconstructions and MIPs were obtained to evaluate the vascular anatomy. CONTRAST:  75 cc Isovue 370 IV COMPARISON:  Chest radiograph earlier this day FINDINGS: Cardiovascular: There are no filling defects within the pulmonary arteries to suggest pulmonary embolus. Thoracic aorta is normal in caliber without dissection. The heart is normal in size. Mediastinum/Nodes: No mediastinal or hilar adenopathy. No pericardial fluid. The esophagus is decompressed. Visualized thyroid gland is normal. Lungs/Pleura: Subsegmental atelectasis in the lingula and anterior left lower lobe. Lungs are otherwise clear. No consolidation. No pulmonary edema or pleural fluid. Trachea and mainstem bronchi are patent. Upper Abdomen: Hepatic steatosis.  No acute abnormality. Musculoskeletal: Scattered degenerative change in the thoracic spine. Prominent Schmorl's node superior endplate of T12. There are no acute or suspicious osseous abnormalities. Review of the MIP images confirms the above findings. IMPRESSION: No pulmonary embolus or acute intrathoracic abnormality. Electronically Signed   By: Rubye Oaks M.D.   On: 07/21/2016 06:27   US Abdomen Limited Ruq  Result Date: 07/21/2016 CLINICAL DATA:  Right upper quadrant abdominal right chest pain. EXAM: ULTRASOUND ABDOMEN LIMITED RIGHT UPPER QUADRANT COMPARISON:  None. FINDINGS: Gallbladder: Physiologically distended. No gallstones or wall thickening visualized. No sonographic Murphy sign noted by sonographer. Common bile duct: Diameter: 2.8 mm. Liver: Liver parenchyma is difficult to penetrate. No focal lesion identified. Diffusely heterogeneous and increased in parenchymal echogenicity. Normal directional flow in the main portal vein. IMPRESSION: 1. Normal sonographic appearance of gallbladder and biliary tree. 2. Hepatic steatosis.  Liver parenchyma is  difficult to penetrate. Electronically Signed   By: Rubye Oaks M.D.   On: 07/21/2016 06:14

## 2016-07-21 NOTE — ED Notes (Signed)
Pt back to room from US and CT.

## 2016-07-21 NOTE — ED Notes (Signed)
Pt to US.

## 2016-07-22 LAB — URINE CULTURE

## 2017-03-05 ENCOUNTER — Other Ambulatory Visit: Payer: Self-pay

## 2017-03-05 ENCOUNTER — Emergency Department
Admission: EM | Admit: 2017-03-05 | Discharge: 2017-03-05 | Disposition: A | Discharge status: Home / Self Care | Payer: Self-pay | Attending: Emergency Medicine | Admitting: Emergency Medicine

## 2017-03-05 ENCOUNTER — Encounter: Payer: Self-pay | Admitting: Emergency Medicine

## 2017-03-05 DIAGNOSIS — J101 Influenza due to other identified influenza virus with other respiratory manifestations: Secondary | ICD-10-CM | POA: Insufficient documentation

## 2017-03-05 DIAGNOSIS — M7918 Myalgia, other site: Secondary | ICD-10-CM | POA: Insufficient documentation

## 2017-03-05 DIAGNOSIS — E119 Type 2 diabetes mellitus without complications: Secondary | ICD-10-CM | POA: Insufficient documentation

## 2017-03-05 DIAGNOSIS — R1110 Vomiting, unspecified: Secondary | ICD-10-CM | POA: Insufficient documentation

## 2017-03-05 DIAGNOSIS — Z79899 Other long term (current) drug therapy: Secondary | ICD-10-CM | POA: Insufficient documentation

## 2017-03-05 LAB — INFLUENZA PANEL BY PCR (TYPE A & B)
Influenza A By PCR: POSITIVE — AB
Influenza B By PCR: NEGATIVE

## 2017-03-05 MED ORDER — OSELTAMIVIR PHOSPHATE 75 MG PO CAPS: 75.0000 mg | ORAL_CAPSULE | Freq: Two times a day (BID) | ORAL | 0 refills | Status: AC

## 2017-03-05 MED ORDER — KETOROLAC TROMETHAMINE 60 MG/2ML IM SOLN
60.0000 mg | Freq: Once | INTRAMUSCULAR | Status: AC
  Administered 2017-03-05: 60 mg via INTRAMUSCULAR
  Filled 2017-03-05: qty 2

## 2017-03-05 MED ORDER — ONDANSETRON 8 MG PO TBDP
8.0000 mg | ORAL_TABLET | Freq: Once | ORAL | Status: AC
  Administered 2017-03-05: 8 mg via ORAL
  Filled 2017-03-05: qty 1

## 2017-03-05 MED ORDER — HYDROCOD POLST-CPM POLST ER 10-8 MG/5ML PO SUER
5.0000 mL | Freq: Once | ORAL | Status: AC
  Administered 2017-03-05: 5 mL via ORAL
  Filled 2017-03-05: qty 5

## 2017-03-05 MED ORDER — PSEUDOEPH-BROMPHEN-DM 30-2-10 MG/5ML PO SYRP: 5.0000 mL | ORAL_SOLUTION | Freq: Four times a day (QID) | ORAL | 0 refills | Status: DC | PRN

## 2017-03-05 MED ORDER — IBUPROFEN 800 MG PO TABS: 800.0000 mg | ORAL_TABLET | Freq: Three times a day (TID) | ORAL | 0 refills | Status: DC | PRN

## 2017-03-05 NOTE — ED Provider Notes (Signed)
[facility] Emergency Department Provider Note   ____________________________________________   First MD Initiated Contact with Patient 03/05/17 [phone]     (approximate)  I have reviewed the triage vital signs and the nursing notes.   HISTORY  Chief Complaint Headache; Emesis; and Generalized Body Aches    HPI Margaret MaclachlanKristine L Olson is a 47 y.o. female patient complained of headache, nasal congestion, coughing, nausea, vomiting, and body aches which started yesterday.  Patient denies diarrhea.  Patient rates the pain as a 10/10.  Patient described the pain is "generalized body aches".  No palates measured for complaint.  Patient not taking flu shot for this season.  Past Medical History:  Diagnosis Date  . Diabetes mellitus without complication (HCC)     There are no active problems to display for this patient.   Past Surgical History:  Procedure Laterality Date  . TUBAL LIGATION      Prior to Admission medications   Medication Sig Start Date End Date Taking? Authorizing Provider  brompheniramine-pseudoephedrine-DM 30-2-10 MG/5ML syrup Take 5 mLs by mouth 4 (four) times daily as needed. 03/05/17   Joni ReiningSmith, Sewell Pitner K, PA-C  carisoprodol (SOMA) 350 MG tablet Take 1 tablet (350 mg total) by mouth 3 (three) times daily as needed for muscle spasms. Patient not taking: Reported on 07/21/2016 02/10/15   Myrna BlazerSchaevitz, David Matthew, MD  HYDROcodone-acetaminophen (NORCO) 5-325 MG tablet Take 1 tablet by mouth every 6 (six) hours as needed for severe pain. 07/21/16   Merrily Brittleifenbark, Neil, MD  ibuprofen (ADVIL,MOTRIN) 800 MG tablet Take 1 tablet (800 mg total) by mouth every 8 (eight) hours as needed for moderate pain. 03/05/17   Joni ReiningSmith, Rendon Howell K, PA-C  ketorolac (ACULAR) 0.5 % ophthalmic solution Place 1 drop into the right eye 4 (four) times daily. 07/07/16   Menshew, Charlesetta IvoryJenise V Bacon, PA-C  oseltamivir (TAMIFLU) 75 MG capsule Take 1 capsule (75 mg total) by mouth 2 (two) times daily for  5 days. 03/05/17 03/10/17  Joni ReiningSmith, Brach Birdsall K, PA-C  predniSONE (STERAPRED UNI-PAK 21 TAB) 10 MG (21) TBPK tablet Take 1 tablet (10 mg total) by mouth daily. Take 6 tablets on first day, 5 tablets on second day, 4 tablets on third day, 3 tablets on fourth day, 2 tablets on five, 1 tablet on day six. Patient not taking: Reported on 07/21/2016 01/10/16   Orvil FeilWoods, Jaclyn M, PA-C    Allergies Patient has no known allergies.  History reviewed. No pertinent family history.  Social History Social History   Tobacco Use  . Smoking status: Never Smoker  . Smokeless tobacco: Never Used  Substance Use Topics  . Alcohol use: No  . Drug use: No    Review of Systems Constitutional: Fever, chills, and body aches.   Eyes: No visual changes. ENT: No sore throat.  Nasal congestion with intermittent runny nose. Cardiovascular: Denies chest pain. Respiratory: Denies shortness of breath.  Coughing Gastrointestinal: No abdominal pain.   Nausea and vomiting.  No diarrhea.  No constipation. Genitourinary: Negative for dysuria. Musculoskeletal: Negative for back pain. Skin: Negative for rash. Neurological: Negative for headaches, focal weakness or numbness.   ____________________________________________   PHYSICAL EXAM:  VITAL SIGNS: ED Triage Vitals  Enc Vitals Group     BP 03/05/17 0626 (!) 102/59     Pulse Rate 03/05/17 0626 (!) 105     Resp 03/05/17 0626 20     Temp 03/05/17 0626 99 F (37.2 C)     Temp Source 03/05/17 0626 Oral  SpO2 03/05/17 0626 97 %     Weight 03/05/17 0628 190 lb (86.2 kg)     Height 03/05/17 0628 5\' 8"  (1.727 m)     Head Circumference --      Peak Flow --      Pain Score 03/05/17 0627 10     Pain Loc --      Pain Edu? --      Excl. in GC? --    Constitutional: Alert and oriented. Well appearing and in no acute distress. Nose: Edematous nasal turbinates clear rhinorrhea Mouth/Throat: Mucous membranes are moist.  Oropharynx non-erythematous.  Postnasal  drainage Hematological/Lymphatic/Immunilogical: No cervical lymphadenopathy. Cardiovascular: Normal rate, regular rhythm. Grossly normal heart sounds.  Good peripheral circulation. Respiratory: Normal respiratory effort.  No retractions. Lungs CTAB.  Nonproductive cough Gastrointestinal: Soft and nontender. No distention. No abdominal bruits. No CVA tenderness. Neurologic:  Normal speech and language. No gross focal neurologic deficits are appreciated. No gait instability. Skin:  Skin is warm, dry and intact. No rash noted. Psychiatric: Mood and affect are normal. Speech and behavior are normal.  ____________________________________________   LABS (all labs ordered are listed, but only abnormal results are displayed)  Labs Reviewed  INFLUENZA PANEL BY PCR (TYPE A & B) - Abnormal; Notable for the following components:      Result Value   Influenza A By PCR POSITIVE (*)    All other components within normal limits   ____________________________________________  EKG   ____________________________________________  RADIOLOGY  ED MD interpretation:    Official radiology report(s): No results found.  ____________________________________________   PROCEDURES  Procedure(s) performed: None  Procedures  Critical Care performed: No  ____________________________________________   INITIAL IMPRESSION / ASSESSMENT AND PLAN / ED COURSE  As part of my medical decision making, I reviewed the following data within the electronic MEDICAL RECORD NUMBER    Viral respiratory infection secondary to influenza A.  Patient given discharge care instruction advised take medication as directed.  Patient advised follow-up PCP as needed.  Patient given a work note.      ____________________________________________   FINAL CLINICAL IMPRESSION(S) / ED DIAGNOSES  Final diagnoses:  Influenza A     ED Discharge Orders        Ordered    oseltamivir (TAMIFLU) 75 MG capsule  2 times daily      03/05/17 0740    brompheniramine-pseudoephedrine-DM 30-2-10 MG/5ML syrup  4 times daily PRN     03/05/17 0740    ibuprofen (ADVIL,MOTRIN) 800 MG tablet  Every 8 hours PRN     03/05/17 0740       Note:  This document was prepared using Dragon voice recognition software and may include unintentional dictation errors.    Joni Reining, PA-C 03/05/17 8119    Emily Filbert, MD 03/05/17 [phone]

## 2017-03-05 NOTE — ED Triage Notes (Signed)
Pt present to ED with headache, vomiting, and body aches since yesterday.

## 2017-03-06 ENCOUNTER — Emergency Department
Admission: EM | Admit: 2017-03-06 | Discharge: 2017-03-06 | Disposition: A | Discharge status: Home / Self Care | Payer: Self-pay | Attending: Student in an Organized Health Care Education/Training Program | Admitting: Student in an Organized Health Care Education/Training Program

## 2017-03-06 ENCOUNTER — Other Ambulatory Visit: Payer: Self-pay

## 2017-03-06 ENCOUNTER — Encounter: Payer: Self-pay | Admitting: Emergency Medicine

## 2017-03-06 DIAGNOSIS — Z79899 Other long term (current) drug therapy: Secondary | ICD-10-CM | POA: Insufficient documentation

## 2017-03-06 DIAGNOSIS — E119 Type 2 diabetes mellitus without complications: Secondary | ICD-10-CM | POA: Insufficient documentation

## 2017-03-06 DIAGNOSIS — J111 Influenza due to unidentified influenza virus with other respiratory manifestations: Secondary | ICD-10-CM | POA: Insufficient documentation

## 2017-03-06 MED ORDER — ONDANSETRON 4 MG PO TBDP: 4.0000 mg | ORAL_TABLET | Freq: Three times a day (TID) | ORAL | 0 refills | Status: DC | PRN

## 2017-03-06 MED ORDER — ACETAMINOPHEN-CODEINE #3 300-30 MG PO TABS: 1.0000 | ORAL_TABLET | Freq: Four times a day (QID) | ORAL | 0 refills | Status: DC | PRN

## 2017-03-06 MED ORDER — ONDANSETRON 4 MG PO TBDP
4.0000 mg | ORAL_TABLET | Freq: Once | ORAL | Status: AC
  Administered 2017-03-06: 4 mg via ORAL
  Filled 2017-03-06: qty 1

## 2017-03-06 MED ORDER — ACETAMINOPHEN-CODEINE #3 300-30 MG PO TABS
1.0000 | ORAL_TABLET | Freq: Once | ORAL | Status: AC
  Administered 2017-03-06: 1 via ORAL
  Filled 2017-03-06: qty 1

## 2017-03-06 NOTE — ED Triage Notes (Signed)
Here for flu yesterday.  Back today because not feeling any better despite starting tamiflu.  Is having stomach cramps.  Has been doing tylenol and motrin.  VSS.

## 2017-03-06 NOTE — ED Provider Notes (Signed)
[facility] Emergency Department Provider Note ____________________________________________  Time seen: 1752  I have reviewed the triage vital signs and the nursing notes.  HISTORY  Chief Complaint  Influenza  HPI Margaret Olson is a 47 y.o. female return to the ED for with continued malaise, abdominal pain, and headache, following influenza diagnosis confirmed yesterday with screening.  Patient started on Tamiflu yesterday and is today reporting some mild stomach cramps.  She has also been dosing Tylenol and Motrin for her fevers and myalgias.  Past Medical History:  Diagnosis Date  . Diabetes mellitus without complication (HCC)     There are no active problems to display for this patient.   Past Surgical History:  Procedure Laterality Date  . TUBAL LIGATION      Prior to Admission medications   Medication Sig Start Date End Date Taking? Authorizing Provider  acetaminophen-codeine (TYLENOL #3) 300-30 MG tablet Take 1 tablet by mouth every 6 (six) hours as needed for moderate pain. 03/06/17   Arion Morgan, Charlesetta Ivory, PA-C  brompheniramine-pseudoephedrine-DM 30-2-10 MG/5ML syrup Take 5 mLs by mouth 4 (four) times daily as needed. 03/05/17   Joni Reining, PA-C  carisoprodol (SOMA) 350 MG tablet Take 1 tablet (350 mg total) by mouth 3 (three) times daily as needed for muscle spasms. Patient not taking: Reported on 07/21/2016 02/10/15   Myrna Blazer, MD  HYDROcodone-acetaminophen (NORCO) 5-325 MG tablet Take 1 tablet by mouth every 6 (six) hours as needed for severe pain. 07/21/16   Merrily Brittle, MD  ibuprofen (ADVIL,MOTRIN) 800 MG tablet Take 1 tablet (800 mg total) by mouth every 8 (eight) hours as needed for moderate pain. 03/05/17   Joni Reining, PA-C  ketorolac (ACULAR) 0.5 % ophthalmic solution Place 1 drop into the right eye 4 (four) times daily. 07/07/16   Daaiyah Baumert, Charlesetta Ivory, PA-C  ondansetron (ZOFRAN ODT) 4 MG disintegrating tablet  Take 1 tablet (4 mg total) by mouth every 8 (eight) hours as needed. 03/06/17   Amer Alcindor, Charlesetta Ivory, PA-C  oseltamivir (TAMIFLU) 75 MG capsule Take 1 capsule (75 mg total) by mouth 2 (two) times daily for 5 days. 03/05/17 03/10/17  Joni Reining, PA-C  predniSONE (STERAPRED UNI-PAK 21 TAB) 10 MG (21) TBPK tablet Take 1 tablet (10 mg total) by mouth daily. Take 6 tablets on first day, 5 tablets on second day, 4 tablets on third day, 3 tablets on fourth day, 2 tablets on five, 1 tablet on day six. Patient not taking: Reported on 07/21/2016 01/10/16   Orvil Feil, PA-C    Allergies Patient has no known allergies.  History reviewed. No pertinent family history.  Social History Social History   Tobacco Use  . Smoking status: Never Smoker  . Smokeless tobacco: Never Used  Substance Use Topics  . Alcohol use: No  . Drug use: No    Review of Systems  Constitutional: Negative for fever. Eyes: Negative for visual changes. ENT: Negative for sore throat. Cardiovascular: Negative for chest pain. Respiratory: Negative for shortness of breath. Gastrointestinal: Positive for abdominal pain, vomiting and diarrhea. Genitourinary: Negative for dysuria. Musculoskeletal: Negative for back pain. Skin: Negative for rash. Neurological: Negative for focal weakness or numbness. Reports headache ____________________________________________  PHYSICAL EXAM:  VITAL SIGNS: ED Triage Vitals  Enc Vitals Group     BP 03/06/17 1748 123/70     Pulse Rate 03/06/17 1748 97     Resp 03/06/17 1748 20     Temp 03/06/17  1748 98.7 F (37.1 C)     Temp Source 03/06/17 1748 Oral     SpO2 03/06/17 1748 95 %     Weight 03/06/17 1749 190 lb (86.2 kg)     Height 03/06/17 1749 5\' 8"  (1.727 m)     Head Circumference --      Peak Flow --      Pain Score 03/06/17 1748 8     Pain Loc --      Pain Edu? --      Excl. in GC? --     Constitutional: Alert and oriented. Well appearing and in no distress. Head:  Normocephalic and atraumatic. Eyes: Conjunctivae are normal. PERRL. Normal extraocular movements Ears: Canals clear. TMs intact bilaterally. Nose: No congestion/rhinorrhea/epistaxis. Cardiovascular: Normal rate, regular rhythm. Normal distal pulses. Respiratory: Normal respiratory effort. No wheezes/rales/rhonchi. Gastrointestinal: Soft and nontender. No distention. Musculoskeletal: Nontender with normal range of motion in all extremities.  Neurologic:  Normal gait without ataxia. Normal speech and language. No gross focal neurologic deficits are appreciated. ____________________________________________  PROCEDURES  Procedures Zofran 4 mg ODT Tylenol #3 i PO ____________________________________________  INITIAL IMPRESSION / ASSESSMENT AND PLAN / ED COURSE  Patient with subsequent ED visit for symptoms related to influenza.  Patient continues to experience mild frontal headache, nausea, vomiting, and loose stools.  Her symptoms likely represent sequelae of influenza versus some worsening as a side effect of the Tamiflu.  Patient is inclined to continue dosing the Tamiflu as prescribed.  She will be discharged with a prescription for Zofran and Tylenol 3 to dose as needed.  She is advised to continue with the previously prescribed medications and to hydrate to prevent dehydration.  She will follow-up with her provider return to the ED as needed. ____________________________________________  FINAL CLINICAL IMPRESSION(S) / ED DIAGNOSES  Final diagnoses:  Influenza      Karmen StabsMenshew, Charlesetta IvoryJenise V Bacon, PA-C 03/06/17 1817    Willy Eddyobinson, Patrick, MD 03/06/17 2007

## 2017-03-06 NOTE — Discharge Instructions (Signed)
You should continue to take your previously prescribed medicines. Tamiflu can cause upset stomach, nausea, and diarrhea. If you find that you can not tolerate the side effects, you should stop the medication and treat your symptoms. Take the nausea medicine and the pain medicine as needed. You may take 1 additional regular or extra strength Tylenol with the Tylenol w/ codeine. Drink sports drinks to prevent dehydration and replenish electrolytes.

## 2017-10-29 ENCOUNTER — Emergency Department
Admission: EM | Admit: 2017-10-29 | Discharge: 2017-10-29 | Disposition: A | Discharge status: Home / Self Care | Payer: Self-pay | Attending: Emergency Medicine | Admitting: Emergency Medicine

## 2017-10-29 ENCOUNTER — Emergency Department: Payer: Self-pay

## 2017-10-29 ENCOUNTER — Encounter: Payer: Self-pay | Admitting: Emergency Medicine

## 2017-10-29 ENCOUNTER — Other Ambulatory Visit: Payer: Self-pay

## 2017-10-29 DIAGNOSIS — Y9289 Other specified places as the place of occurrence of the external cause: Secondary | ICD-10-CM | POA: Insufficient documentation

## 2017-10-29 DIAGNOSIS — S86912A Strain of unspecified muscle(s) and tendon(s) at lower leg level, left leg, initial encounter: Secondary | ICD-10-CM | POA: Insufficient documentation

## 2017-10-29 DIAGNOSIS — Y9389 Activity, other specified: Secondary | ICD-10-CM | POA: Insufficient documentation

## 2017-10-29 DIAGNOSIS — E119 Type 2 diabetes mellitus without complications: Secondary | ICD-10-CM | POA: Insufficient documentation

## 2017-10-29 DIAGNOSIS — M25462 Effusion, left knee: Secondary | ICD-10-CM | POA: Insufficient documentation

## 2017-10-29 DIAGNOSIS — X509XXA Other and unspecified overexertion or strenuous movements or postures, initial encounter: Secondary | ICD-10-CM | POA: Insufficient documentation

## 2017-10-29 DIAGNOSIS — Y999 Unspecified external cause status: Secondary | ICD-10-CM | POA: Insufficient documentation

## 2017-10-29 MED ORDER — NAPROXEN 500 MG PO TABS
500.0000 mg | ORAL_TABLET | Freq: Once | ORAL | Status: AC
  Administered 2017-10-29: 500 mg via ORAL
  Filled 2017-10-29: qty 1

## 2017-10-29 MED ORDER — DICLOFENAC POTASSIUM 50 MG PO TABS: 50.0000 mg | ORAL_TABLET | Freq: Two times a day (BID) | ORAL | 0 refills | Status: DC

## 2017-10-29 NOTE — Discharge Instructions (Signed)
You are being treated for a knee sprain with a small effusion. There does not appear to be a signs of a serious ligament tear or meniscus injury. Wear the knee immobilizer as needed for support. Rest with the leg elevated, and apply ice to reduce swelling. Take the anti-inflammatory or OTC ibuprofen for pain and inflammation. Follow-up with your provider for ongoing symptoms.

## 2017-10-29 NOTE — ED Triage Notes (Signed)
PT to ED c/o LFT knee pain xfew days, no known injury. No deformity noted, slow gait noted. VSS

## 2017-10-29 NOTE — ED Provider Notes (Signed)
[facility] Emergency Department Provider Note ____________________________________________  Time seen: 1541  I have reviewed the triage vital signs and the nursing notes.  HISTORY  Chief Complaint  Knee Pain  HPI Margaret Olson is a 47 y.o. female resents herself to the ED for evaluation of left knee pain, swelling, and disability.  Patient denies any particular injury, accident, trauma, preceding the joint swelling.  She speculates that perhaps her exercise class may have led to a strain or sprain.  She denies any previous history of ongoing or chronic knee pain.  She presents today noting pain and stiffness with attempts to fully extend or bend the knee.  She feels as though the knee joint is somewhat swollen.  She has been wearing an Ace bandage for support, and has taken 1 dose of Goody's powder in the interim.  She presents now for further evaluation.  Past Medical History:  Diagnosis Date  . Diabetes mellitus without complication (HCC)     There are no active problems to display for this patient.   Past Surgical History:  Procedure Laterality Date  . TUBAL LIGATION      Prior to Admission medications   Medication Sig Start Date End Date Taking? Authorizing Provider  diclofenac (CATAFLAM) 50 MG tablet Take 1 tablet (50 mg total) by mouth 2 (two) times daily. 10/29/17   Silvina Hackleman, Charlesetta Ivory, PA-C    Allergies Patient has no known allergies.  No family history on file.  Social History Social History   Tobacco Use  . Smoking status: Never Smoker  . Smokeless tobacco: Never Used  Substance Use Topics  . Alcohol use: No  . Drug use: No    Review of Systems  Constitutional: Negative for fever. Cardiovascular: Negative for chest pain. Respiratory: Negative for shortness of breath. Musculoskeletal: Negative for back pain.  Left knee pain as above. Skin: Negative for rash. Neurological: Negative for headaches, focal weakness or  numbness. ____________________________________________  PHYSICAL EXAM:  VITAL SIGNS: ED Triage Vitals [10/29/17 1440]  Enc Vitals Group     BP 132/74     Pulse Rate 79     Resp 16     Temp 97.7 F (36.5 C)     Temp Source Oral     SpO2 99 %     Weight 180 lb (81.6 kg)     Height 5\' 8"  (1.727 m)     Head Circumference      Peak Flow      Pain Score 8     Pain Loc      Pain Edu?      Excl. in GC?     Constitutional: Alert and oriented. Well appearing and in no distress. Head: Normocephalic and atraumatic. Cardiovascular: Normal rate, regular rhythm. Normal distal pulses. Respiratory: Normal respiratory effort. No wheezes/rales/rhonchi. Musculoskeletal: Left knee with a mild effusion. Decreased flexion ROM. No valgus or varus stress. No popliteal space fullness. No calf or achilles tenderness distally. Nontender with normal range of motion in all extremities.  Neurologic:  Antalgic gait without ataxia. Normal speech and language. No gross focal neurologic deficits are appreciated. Skin:  Skin is warm, dry and intact. No rash noted. ____________________________________________   RADIOLOGY  Left Knee  IMPRESSION: Mild medial compartment degenerative change. No acute bony abnormality identified. No evidence of fracture. ____________________________________________  PROCEDURES  Naproxen 500 mg PO  .Splint Application Date/Time: 10/29/2017 4:28 PM Performed by: Lissa Hoard, PA-C Authorized by: Lissa Hoard,  PA-C   Consent:    Consent obtained:  Verbal   Consent given by:  Patient   Alternatives discussed:  Alternative treatment Pre-procedure details:    Sensation:  Normal Procedure details:    Laterality:  Left   Location:  Knee   Strapping: no     Splint type:  Knee immobilizer   Supplies:  Prefabricated splint Post-procedure details:    Pain:  Improved   Sensation:  Normal   Patient tolerance of procedure:  Tolerated well, no  immediate complications  ____________________________________________  INITIAL IMPRESSION / ASSESSMENT AND PLAN / ED COURSE  Patient with ED evaluation of left knee effusion and strain. She is placed in a knee immobilizer for comfort. She will be discharged with a prescription for Diclofenac. She should see her provider in 1 week as needed.  ____________________________________________  FINAL CLINICAL IMPRESSION(S) / ED DIAGNOSES  Final diagnoses:  Strain of left knee, initial encounter  Effusion of left knee      Lissa Hoard, PA-C 10/29/17 1631    Myrna Blazer, MD 10/29/17 [phone]

## 2017-10-29 NOTE — ED Notes (Signed)
See triage note  Presents with left knee pain   States pain started couple of days ago w/o injury  States pain is lateral and posterior  With some swelling

## 2018-03-25 ENCOUNTER — Encounter: Payer: Self-pay | Admitting: Family Medicine

## 2018-03-25 ENCOUNTER — Ambulatory Visit (INDEPENDENT_AMBULATORY_CARE_PROVIDER_SITE_OTHER): Payer: BLUE CROSS/BLUE SHIELD | Admitting: Family Medicine

## 2018-03-25 ENCOUNTER — Encounter (INDEPENDENT_AMBULATORY_CARE_PROVIDER_SITE_OTHER): Payer: Self-pay

## 2018-03-25 VITALS — BP 136/80 | HR 65 | Resp 12 | Ht 67.5 in | Wt 172.7 lb

## 2018-03-25 DIAGNOSIS — G8929 Other chronic pain: Secondary | ICD-10-CM

## 2018-03-25 DIAGNOSIS — E119 Type 2 diabetes mellitus without complications: Secondary | ICD-10-CM | POA: Diagnosis not present

## 2018-03-25 DIAGNOSIS — Z23 Encounter for immunization: Secondary | ICD-10-CM

## 2018-03-25 DIAGNOSIS — N912 Amenorrhea, unspecified: Secondary | ICD-10-CM | POA: Diagnosis not present

## 2018-03-25 DIAGNOSIS — A048 Other specified bacterial intestinal infections: Secondary | ICD-10-CM

## 2018-03-25 DIAGNOSIS — M545 Low back pain: Secondary | ICD-10-CM | POA: Diagnosis not present

## 2018-03-25 DIAGNOSIS — R109 Unspecified abdominal pain: Secondary | ICD-10-CM

## 2018-03-25 DIAGNOSIS — Z114 Encounter for screening for human immunodeficiency virus [HIV]: Secondary | ICD-10-CM

## 2018-03-25 DIAGNOSIS — Z1239 Encounter for other screening for malignant neoplasm of breast: Secondary | ICD-10-CM

## 2018-03-25 DIAGNOSIS — Z0000 Encounter for general adult medical examination without abnormal findings: Secondary | ICD-10-CM

## 2018-03-25 NOTE — Patient Instructions (Signed)
Return in one month for a physical We'll get labs today If you have not heard anything from my staff in a week about any orders/referrals/studies from today, please contact us here to follow-up [phone] Keep an eye on your feet and let me know of any worsening of the callus

## 2018-03-25 NOTE — Progress Notes (Signed)
BP 136/80   Pulse 65   Resp 12   Ht 5' 7.5" (1.715 m)   Wt 172 lb 11.2 oz (78.3 kg)   SpO2 98%   BMI 26.65 kg/m    Subjective:    Patient ID: Margaret Olson, female    DOB: 1970/11/27, 48 y.o.   MRN: [id number]  HPI: Margaret Olson is a 48 y.o. female  Chief Complaint  Patient presents with  . Establish Care    HPI She is here to establish care Not many concerns   Type 2 diabetes mellitus Lab Results  Component Value Date   HGBA1C 5.3 03/25/2018   Calluses on the soles of the feet Not checking FSBS could be controloled withj diet or medicine, tried metformin and had GI upset She lost 40 pounds and feels greatfeels great Smaller portions, cut out certain foods Last labs were 18 months ago No dry mouth, no blurred vision after eating Grandfather had diabetes Peak weight was 209 pounds  Had a tetanus shot in June, flu shot today Mammogram 2 years ago Pap smear due  Lower back pain; had xrays at the ER; sitting for prolonged periods worsens the pain Stretches a lot, but still feels stiff Stiffness is worse in the morning Very low, also had ovarian cyst years ago; no periods; LMP was in early to mid 40s Occasional hot flashes, but never really through menopause  CLINICAL DATA:  Low back pain for 2 weeks, no known injury, initial encounter  EXAM: LUMBAR SPINE - COMPLETE 4+ VIEW  COMPARISON:  None.  FINDINGS: Five lumbar type vertebral bodies are well visualized. Vertebral body height is well maintained. No pars defects are seen. No spondylolisthesis is noted. Mild osteophytic change is seen worst at L2-3. No soft tissue abnormality is noted.  IMPRESSION: Mild degenerative change without acute abnormality.   Electronically Signed   By: Alcide Clever M.D.   On: 01/10/2016 11:00  Depression screen PHQ 2/9 03/25/2018  Decreased Interest 0  Down, Depressed, Hopeless 0  PHQ - 2 Score 0  Altered sleeping 0  Tired, decreased energy 0  Change  in appetite 0  Feeling bad or failure about yourself  0  Trouble concentrating 0  Moving slowly or fidgety/restless 0  Suicidal thoughts 0  PHQ-9 Score 0  Difficult doing work/chores Not difficult at all   Fall Risk  03/25/2018  Falls in the past year? 0  Number falls in past yr: 0  Injury with Fall? 0    Relevant past medical, surgical, family and social history reviewed Past Medical History:  Diagnosis Date  . Diabetes mellitus without complication [facility])    Past Surgical History:  Procedure Laterality Date  . TUBAL LIGATION     Family History  Problem Relation Age of Onset  . Hypertension Father   . Diabetes Maternal Grandfather   . Cancer Maternal Grandfather   . Heart attack Paternal Grandmother   . Breast cancer Paternal Grandmother    Social History   Tobacco Use  . Smoking status: Never Smoker  . Smokeless tobacco: Never Used  Substance Use Topics  . Alcohol use: No  . Drug use: No     Office Visit from 03/25/2018 in [facility]  AUDIT-C Score  0      Interim medical history since last visit reviewed. Allergies and medications reviewed  Review of Systems Per HPI unless specifically indicated above     Objective:    BP 136/80  Pulse 65   Resp 12   Ht 5' 7.5" (1.715 m)   Wt 172 lb 11.2 oz (78.3 kg)   SpO2 98%   BMI 26.65 kg/m   Wt Readings from Last 3 Encounters:  03/25/18 172 lb 11.2 oz (78.3 kg)  10/29/17 180 lb (81.6 kg)  03/06/17 190 lb (86.2 kg)    Physical Exam Constitutional:      General: She is not in acute distress.    Appearance: She is well-developed. She is not diaphoretic.  HENT:     Head: Normocephalic and atraumatic.  Eyes:     General: No scleral icterus. Neck:     Thyroid: No thyromegaly.  Cardiovascular:     Rate and Rhythm: Normal rate and regular rhythm.     Heart sounds: Normal heart sounds. No murmur.  Pulmonary:     Effort: Pulmonary effort is normal. No respiratory distress.     Breath  sounds: Normal breath sounds. No wheezing.  Abdominal:     General: Bowel sounds are normal. There is no distension.     Palpations: Abdomen is soft.  Feet:     Right foot:     Protective Sensation: 5 sites tested.     Left foot:     Protective Sensation: 5 sites tested.     Comments: Callus bilaterally on the soles central ball of each foot Skin:    General: Skin is warm and dry.     Coloration: Skin is not pale.  Neurological:     Mental Status: She is alert.  Psychiatric:        Mood and Affect: Mood is not anxious or depressed.        Behavior: Behavior normal.        Thought Content: Thought content normal.        Judgment: Judgment normal.    Diabetic Foot Form - Detailed   Diabetic Foot Exam - detailed Diabetic Foot exam was performed with the following findings:  Yes 03/25/2018  4:58 PM  Visual Foot Exam completed.:  Yes  Pulse Foot Exam completed.:  Yes  Right Dorsalis Pedis:  Present Left Dorsalis Pedis:  Present  Sensory Foot Exam Completed.:  Yes Semmes-Weinstein Monofilament Test R Site 1-Great Toe:  Pos L Site 1-Great Toe:  Pos          Assessment & Plan:   Problem List Items Addressed This Visit      Endocrine   Type 2 diabetes mellitus without complication, without long-term current use of insulin (HCC) - Primary    Foot exam by MD today; check labs      Relevant Orders   Microalbumin / creatinine urine ratio (Completed)   Hemoglobin A1c (Completed)     Other   H. pylori infection   Relevant Orders   H. pylori breath test (Completed)   Chronic bilateral low back pain without sciatica   Relevant Orders   Urinalysis w microscopic + reflex cultur (Completed)   ANA,IFA RA Diag Pnl w/rflx Tit/Patn   Amenorrhea    Check labs to see if menopausal      Relevant Orders   FSH/LH (Completed)    Other Visit Diagnoses    Stomach pain       with hx of h pylori, never tested for cure; will get labs; no red flags at this time   Relevant Orders   H.  pylori breath test (Completed)   Screening for HIV (human immunodeficiency virus)  order   Relevant Orders   HIV antibody (with reflex)   Screening for breast cancer       order mammo   Relevant Orders   MM 3D SCREEN BREAST BILATERAL   Preventative health care       labs ordered today since she is getting venipuncture and she'll return for labs   Relevant Orders   CBC with Differential/Platelet (Completed)   COMPLETE METABOLIC PANEL WITH GFR (Completed)   Lipid panel (Completed)   TSH (Completed)   Need for influenza vaccination       give flu shot today   Relevant Orders   Flu Vaccine QUAD 6+ mos PF IM (Fluarix Quad PF) (Completed)       Follow up plan: Return in about 1 month (around 04/23/2018) for cpe with pap.  An after-visit summary was printed and given to the patient at check-out.  Please see the patient instructions which may contain other information and recommendations beyond what is mentioned above in the assessment and plan.  No orders of the defined types were placed in this encounter.   Orders Placed This Encounter  Procedures  . MM 3D SCREEN BREAST BILATERAL  . Flu Vaccine QUAD 6+ mos PF IM (Fluarix Quad PF)  . HIV antibody (with reflex)  . Microalbumin / creatinine urine ratio  . Hemoglobin A1c  . CBC with Differential/Platelet  . COMPLETE METABOLIC PANEL WITH GFR  . Lipid panel  . TSH  . Urinalysis w microscopic + reflex cultur  . ANA,IFA RA Diag Pnl w/rflx Tit/Patn  . FSH/LH  . H. pylori breath test  . HIV Antibody (routine testing w rflx)  . REFLEXIVE URINE CULTURE

## 2018-03-26 ENCOUNTER — Telehealth: Payer: Self-pay | Admitting: *Deleted

## 2018-03-26 DIAGNOSIS — A048 Other specified bacterial intestinal infections: Secondary | ICD-10-CM | POA: Insufficient documentation

## 2018-03-26 DIAGNOSIS — E119 Type 2 diabetes mellitus without complications: Secondary | ICD-10-CM | POA: Insufficient documentation

## 2018-03-26 DIAGNOSIS — N912 Amenorrhea, unspecified: Secondary | ICD-10-CM | POA: Insufficient documentation

## 2018-03-26 DIAGNOSIS — G8929 Other chronic pain: Secondary | ICD-10-CM | POA: Insufficient documentation

## 2018-03-26 DIAGNOSIS — M545 Low back pain: Secondary | ICD-10-CM

## 2018-03-26 LAB — CBC WITH DIFFERENTIAL/PLATELET
Absolute Monocytes: 343 cells/uL (ref 200–950)
BASOS ABS: 49 {cells}/uL (ref 0–200)
Basophils Relative: 0.7 %
EOS PCT: 1.4 %
Eosinophils Absolute: 98 cells/uL (ref 15–500)
HEMATOCRIT: 40.5 % (ref 35.0–45.0)
Hemoglobin: 13.7 g/dL (ref 11.7–15.5)
LYMPHS ABS: 2660 {cells}/uL (ref 850–3900)
MCH: 29.5 pg (ref 27.0–33.0)
MCHC: 33.8 g/dL (ref 32.0–36.0)
MCV: 87.3 fL (ref 80.0–100.0)
MPV: 11.8 fL (ref 7.5–12.5)
Monocytes Relative: 4.9 %
NEUTROS PCT: 55 %
Neutro Abs: 3850 cells/uL (ref 1500–7800)
PLATELETS: 295 10*3/uL (ref 140–400)
RBC: 4.64 10*6/uL (ref 3.80–5.10)
RDW: 11.9 % (ref 11.0–15.0)
TOTAL LYMPHOCYTE: 38 %
WBC: 7 10*3/uL (ref 3.8–10.8)

## 2018-03-26 LAB — ANA,IFA RA DIAG PNL W/RFLX TIT/PATN: Anti Nuclear Antibody(ANA): NEGATIVE

## 2018-03-26 LAB — URINALYSIS W MICROSCOPIC + REFLEX CULTURE
Bacteria, UA: NONE SEEN /HPF
Bilirubin Urine: NEGATIVE
Glucose, UA: NEGATIVE
Hgb urine dipstick: NEGATIVE
Hyaline Cast: NONE SEEN /LPF
Ketones, ur: NEGATIVE
Leukocyte Esterase: NEGATIVE
NITRITES URINE, INITIAL: NEGATIVE
Protein, ur: NEGATIVE
RBC / HPF: NONE SEEN /HPF (ref 0–2)
SPECIFIC GRAVITY, URINE: 1.019 (ref 1.001–1.03)
WBC, UA: NONE SEEN /HPF (ref 0–5)
pH: <=5 (ref 5.0–8.0)

## 2018-03-26 LAB — LIPID PANEL
CHOL/HDL RATIO: 3 (calc) (ref <5.0)
Cholesterol: 199 mg/dL (ref <200)
HDL: 67 mg/dL (ref >=50)
LDL Cholesterol (Calc): 114 mg/dL (calc) — ABNORMAL HIGH
NON-HDL CHOLESTEROL (CALC): 132 mg/dL — AB (ref <130)
Triglycerides: 85 mg/dL (ref <150)

## 2018-03-26 LAB — MICROALBUMIN / CREATININE URINE RATIO
CREATININE, URINE: 101 mg/dL (ref 20–275)
Microalb Creat Ratio: 10 mcg/mg creat (ref <30)
Microalb, Ur: 1 mg/dL

## 2018-03-26 LAB — HEMOGLOBIN A1C
EAG (MMOL/L): 5.8 (calc)
HEMOGLOBIN A1C: 5.3 %{Hb} (ref <5.7)
MEAN PLASMA GLUCOSE: 105 (calc)

## 2018-03-26 LAB — COMPLETE METABOLIC PANEL WITH GFR
AG Ratio: 1.4 (calc) (ref 1.0–2.5)
ALKALINE PHOSPHATASE (APISO): 81 U/L (ref 31–125)
ALT: 27 U/L (ref 6–29)
AST: 20 U/L (ref 10–35)
Albumin: 4.4 g/dL (ref 3.6–5.1)
BUN: 15 mg/dL (ref 7–25)
CALCIUM: 9.4 mg/dL (ref 8.6–10.2)
CO2: 29 mmol/L (ref 20–32)
CREATININE: 0.73 mg/dL (ref 0.50–1.10)
Chloride: 101 mmol/L (ref 98–110)
GFR, EST NON AFRICAN AMERICAN: 97 mL/min/{1.73_m2} (ref >=60)
GFR, Est African American: 113 mL/min/{1.73_m2} (ref >=60)
GLOBULIN: 3.2 g/dL (ref 1.9–3.7)
GLUCOSE: 94 mg/dL (ref 65–99)
Potassium: 4.2 mmol/L (ref 3.5–5.3)
SODIUM: 139 mmol/L (ref 135–146)
Total Bilirubin: 0.7 mg/dL (ref 0.2–1.2)
Total Protein: 7.6 g/dL (ref 6.1–8.1)

## 2018-03-26 LAB — HIV ANTIBODY (ROUTINE TESTING W REFLEX): HIV 1&2 Ab, 4th Generation: NONREACTIVE

## 2018-03-26 LAB — NO CULTURE INDICATED

## 2018-03-26 LAB — H. PYLORI BREATH TEST: H. PYLORI BREATH TEST: NOT DETECTED

## 2018-03-26 LAB — FSH/LH
FSH: 44.2 m[IU]/mL
LH: 20.7 m[IU]/mL

## 2018-03-26 LAB — TSH: TSH: 1.37 mIU/L

## 2018-03-26 NOTE — Progress Notes (Signed)
Margaret Olson, please let the patient know also... she is not spilling excessive protein through her kidneys; good news

## 2018-03-26 NOTE — Assessment & Plan Note (Signed)
Check labs to see if menopausal

## 2018-03-26 NOTE — Telephone Encounter (Signed)
Lab results mailed.

## 2018-03-26 NOTE — Progress Notes (Signed)
Margaret Olson, please let the patient know that her: 1. CBC is normal 2. CMP is normal 3. Cholesterol is pretty good. I'll recommend a diet low in saturated fats to help bring her LDL down, but no medicine is needed at this time. 4. Urine is normal

## 2018-03-26 NOTE — Progress Notes (Signed)
Margaret Olson, please let the patient know also that her thyroid test is normal

## 2018-03-26 NOTE — Telephone Encounter (Signed)
Patient is calling for her lab results- reviewed resulted results and provider comments. Patient request copy mailed to her as she has not had a chance to set up her MyChart account yet. Patient is aware she still has pending results.  Notes recorded by Kerman Passey, MD on 03/26/2018 at 1:26 AM EST Cala Bradford, please let the patient know that her: 1. CBC is normal 2. CMP is normal 3. Cholesterol is pretty good. I'll recommend a diet low in saturated fats to help bring her LDL down, but no medicine is needed at this time. 4. Urine is normal   ( Lab results nor released to nurse triage basket)

## 2018-03-26 NOTE — Assessment & Plan Note (Signed)
Foot exam by MD today; check labs 

## 2018-04-29 ENCOUNTER — Encounter: Payer: BLUE CROSS/BLUE SHIELD | Admitting: Family Medicine

## 2018-07-28 ENCOUNTER — Encounter: Payer: Self-pay | Admitting: Nurse Practitioner

## 2018-07-28 ENCOUNTER — Other Ambulatory Visit: Payer: Self-pay

## 2018-07-28 ENCOUNTER — Ambulatory Visit (INDEPENDENT_AMBULATORY_CARE_PROVIDER_SITE_OTHER): Payer: BLUE CROSS/BLUE SHIELD | Admitting: Nurse Practitioner

## 2018-07-28 VITALS — BP 120/78 | HR 76 | Temp 97.1°F | Resp 14 | Ht 68.0 in | Wt 181.0 lb

## 2018-07-28 DIAGNOSIS — M79643 Pain in unspecified hand: Secondary | ICD-10-CM | POA: Diagnosis not present

## 2018-07-28 DIAGNOSIS — M79641 Pain in right hand: Secondary | ICD-10-CM | POA: Diagnosis not present

## 2018-07-28 DIAGNOSIS — M79642 Pain in left hand: Secondary | ICD-10-CM

## 2018-07-28 DIAGNOSIS — R252 Cramp and spasm: Secondary | ICD-10-CM | POA: Diagnosis not present

## 2018-07-28 DIAGNOSIS — M7989 Other specified soft tissue disorders: Secondary | ICD-10-CM

## 2018-07-28 DIAGNOSIS — M2560 Stiffness of unspecified joint, not elsewhere classified: Secondary | ICD-10-CM

## 2018-07-28 DIAGNOSIS — M79601 Pain in right arm: Secondary | ICD-10-CM

## 2018-07-28 DIAGNOSIS — B359 Dermatophytosis, unspecified: Secondary | ICD-10-CM

## 2018-07-28 DIAGNOSIS — E119 Type 2 diabetes mellitus without complications: Secondary | ICD-10-CM

## 2018-07-28 DIAGNOSIS — R202 Paresthesia of skin: Secondary | ICD-10-CM

## 2018-07-28 DIAGNOSIS — M79602 Pain in left arm: Secondary | ICD-10-CM

## 2018-07-28 MED ORDER — CLOTRIMAZOLE 1 % EX OINT: 1.0000 "application " | TOPICAL_OINTMENT | Freq: Two times a day (BID) | CUTANEOUS | 1 refills | Status: DC

## 2018-07-28 NOTE — Progress Notes (Addendum)
Name: Margaret Olson   MRN: [id number]    DOB: [birth year]   Date:07/28/2018       Progress Note  Subjective  Chief Complaint  Chief Complaint  Patient presents with  . Hand Pain    Bilateral, onset 1 week ago    HPI  Patient states a week ago started to notice bilateral upper extremity numbness and tingling from elbow down to hand. Patient states has been having intermittent swelling in bilateral hands. States started taking tylenol arthritis with mild relief of symptoms. States is cramping pain, morning stiffness and it improves throughout the day. Took keratin supplements last week before she noticed the swelling  Refurnishes furniture   Had a history of diabetes- was on metformin but was unable to tolerate and has been able to control it with diet and weight loss.   In the summers gets fungal infection on her arms, was seeing dermatologist and taking clomitrazole when needed. Requesting refill.  PHQ2/9: Depression screen [facility] 2/9 07/28/2018 03/25/2018  Decreased Interest 0 0  Down, Depressed, Hopeless 0 0  PHQ - 2 Score 0 0  Altered sleeping 0 0  Tired, decreased energy 0 0  Change in appetite 0 0  Feeling bad or failure about yourself  0 0  Trouble concentrating 0 0  Moving slowly or fidgety/restless - 0  Suicidal thoughts 0 0  PHQ-9 Score 0 0  Difficult doing work/chores Not difficult at all Not difficult at all     PHQ reviewed. Negative  Patient Active Problem List   Diagnosis Date Noted  . Type 2 diabetes mellitus without complication, without long-term current use of insulin ([place]) 03/26/2018  . Amenorrhea 03/26/2018  . Chronic bilateral low back pain without sciatica 03/26/2018  . H. pylori infection 03/26/2018    Past Medical History:  Diagnosis Date  . Diabetes mellitus without complication [facility])     Past Surgical History:  Procedure Laterality Date  . TUBAL LIGATION      Social History   Tobacco Use  . Smoking status: Never Smoker  . Smokeless  tobacco: Never Used  Substance Use Topics  . Alcohol use: No     Current Outpatient Medications:  .  acetaminophen (TYLENOL) 650 MG CR tablet, Take 650 mg by mouth every 8 (eight) hours as needed for pain., Disp: , Rfl:  .  diclofenac (CATAFLAM) 50 MG tablet, Take 1 tablet (50 mg total) by mouth 2 (two) times daily. (Patient not taking: Reported on 03/25/2018), Disp: 30 tablet, Rfl: 0  No Known Allergies  ROS    No other specific complaints in a complete review of systems (except as listed in HPI above).  Objective  Vitals:   07/28/18 1054  BP: 120/78  Pulse: 76  Resp: 14  Temp: (!) 97.1 F (36.2 C)  TempSrc: Oral  SpO2: 99%  Weight: 181 lb (82.1 kg)  Height: 5' 8"  (1.727 m)    Body mass index is 27.52 kg/m.  Nursing Note and Vital Signs reviewed.  Physical Exam Constitutional:      Appearance: Normal appearance. She is not ill-appearing.  HENT:     Head: Normocephalic and atraumatic.  Neck:     Musculoskeletal: Normal range of motion and neck supple. No neck rigidity or muscular tenderness.  Cardiovascular:     Rate and Rhythm: Normal rate.  Pulmonary:     Effort: Pulmonary effort is normal.  Musculoskeletal:     Left elbow: Normal.     Right wrist: She exhibits  decreased range of motion and swelling. She exhibits no tenderness.     Left wrist: She exhibits decreased range of motion and swelling.     Right hand: She exhibits decreased range of motion and swelling. She exhibits no tenderness, normal capillary refill, no deformity and no laceration. Decreased sensation noted.     Left hand: She exhibits decreased range of motion and swelling. She exhibits no tenderness, no bony tenderness, normal capillary refill and no deformity. Decreased sensation noted.  Skin:    General: Skin is warm and dry.  Neurological:     General: No focal deficit present.     Mental Status: She is alert and oriented to person, place, and time.     Gait: Gait normal.  Psychiatric:         Mood and Affect: Mood normal.        Behavior: Behavior normal.      No results found for this or any previous visit (from the past 48 hour(s)).  Assessment & Plan  1. Bilateral hand pain Sodium and fluid restriction, acetaminophen PRN, ice and elevation. Pending results consider NSAID vs steroid or rheum/orth referral.   2. Morning stiffness of joints - C-reactive protein - Sed Rate (ESR)  3. Muscle cramping - COMPLETE METABOLIC PANEL WITH GFR - Magnesium  4. Intermittent pain and swelling of hand - COMPLETE METABOLIC PANEL WITH GFR - Magnesium - C-reactive protein - Sed Rate (ESR)  5. Tinea - Clotrimazole 1 % OINT; Apply 1 application topically 2 (two) times a day.  Dispense: 50 g; Refill: 1  6. Type 2 diabetes mellitus without complication, without long-term current use of insulin (HCC) Well-controlled   7. Paresthesia and pain of both upper extremities - B12

## 2018-07-28 NOTE — Patient Instructions (Addendum)
-   Continue to drink plenty of water but no more than 2 liters a day - limit sodium as much as you can to no more that 2 grams a day - Please use cold compress and elevation to reduce swelling.  - Continue to take tylenol as directed on bottle.  - If you have not heard anything from my staff in a week about any orders/referrals/studies from today, please contact us here to follow-up [phone]  Please do call to schedule your mammogram; the number to schedule one at either [facility] or [facility] is [phone]

## 2018-07-28 NOTE — Addendum Note (Signed)
Addended by: Suezanne Cheshire E on: 07/28/2018 01:00 PM   Modules accepted: Orders

## 2018-07-29 LAB — COMPLETE METABOLIC PANEL WITH GFR
AG Ratio: 1.6 (calc) (ref 1.0–2.5)
ALT: 28 U/L (ref 6–29)
AST: 17 U/L (ref 10–35)
Albumin: 4.1 g/dL (ref 3.6–5.1)
Alkaline phosphatase (APISO): 73 U/L (ref 31–125)
BUN: 15 mg/dL (ref 7–25)
CO2: 29 mmol/L (ref 20–32)
Calcium: 9.4 mg/dL (ref 8.6–10.2)
Chloride: 105 mmol/L (ref 98–110)
Creat: 0.63 mg/dL (ref 0.50–1.10)
GFR, Est African American: 123 mL/min/{1.73_m2} (ref >=60)
GFR, Est Non African American: 106 mL/min/{1.73_m2} (ref >=60)
Globulin: 2.6 g/dL (calc) (ref 1.9–3.7)
Glucose, Bld: 122 mg/dL — ABNORMAL HIGH (ref 65–99)
Potassium: 4.4 mmol/L (ref 3.5–5.3)
Sodium: 143 mmol/L (ref 135–146)
Total Bilirubin: 0.5 mg/dL (ref 0.2–1.2)
Total Protein: 6.7 g/dL (ref 6.1–8.1)

## 2018-07-29 LAB — TEST AUTHORIZATION

## 2018-07-29 LAB — C-REACTIVE PROTEIN: CRP: 3 mg/L (ref <8.0)

## 2018-07-29 LAB — MAGNESIUM: Magnesium: 1.9 mg/dL (ref 1.5–2.5)

## 2018-07-29 LAB — VITAMIN B12: Vitamin B-12: 597 pg/mL (ref 200–1100)

## 2018-07-29 LAB — SEDIMENTATION RATE: Sed Rate: 2 mm/h (ref 0–20)

## 2019-03-26 ENCOUNTER — Encounter: Payer: Self-pay | Admitting: Family Medicine

## 2019-03-26 ENCOUNTER — Encounter: Payer: Self-pay | Admitting: Intensive Care

## 2019-03-26 ENCOUNTER — Ambulatory Visit (INDEPENDENT_AMBULATORY_CARE_PROVIDER_SITE_OTHER): Payer: BLUE CROSS/BLUE SHIELD | Admitting: Family Medicine

## 2019-03-26 ENCOUNTER — Other Ambulatory Visit: Payer: Self-pay

## 2019-03-26 ENCOUNTER — Emergency Department: Payer: BLUE CROSS/BLUE SHIELD

## 2019-03-26 ENCOUNTER — Emergency Department
Admission: EM | Admit: 2019-03-26 | Discharge: 2019-03-26 | Disposition: A | Discharge status: Home / Self Care | Payer: BLUE CROSS/BLUE SHIELD | Attending: Emergency Medicine | Admitting: Emergency Medicine

## 2019-03-26 VITALS — BP 112/74 | HR 93 | Temp 97.1°F | Resp 16 | Ht 68.0 in | Wt 189.5 lb

## 2019-03-26 DIAGNOSIS — R10813 Right lower quadrant abdominal tenderness: Secondary | ICD-10-CM

## 2019-03-26 DIAGNOSIS — R1031 Right lower quadrant pain: Secondary | ICD-10-CM | POA: Insufficient documentation

## 2019-03-26 DIAGNOSIS — E119 Type 2 diabetes mellitus without complications: Secondary | ICD-10-CM | POA: Insufficient documentation

## 2019-03-26 DIAGNOSIS — M545 Low back pain, unspecified: Secondary | ICD-10-CM

## 2019-03-26 LAB — POCT URINALYSIS DIPSTICK
Bilirubin, UA: NEGATIVE
Blood, UA: NEGATIVE
Glucose, UA: NEGATIVE
Ketones, UA: NEGATIVE
Leukocytes, UA: NEGATIVE
Nitrite, UA: NEGATIVE
Odor: NORMAL
Protein, UA: NEGATIVE
Spec Grav, UA: 1.025 (ref 1.010–1.025)
Urobilinogen, UA: 0.2 E.U./dL
pH, UA: 6 (ref 5.0–8.0)

## 2019-03-26 LAB — LIPASE, BLOOD: Lipase: 25 U/L (ref 11–51)

## 2019-03-26 LAB — CBC
HCT: 40.7 % (ref 36.0–46.0)
Hemoglobin: 13.4 g/dL (ref 12.0–15.0)
MCH: 28.8 pg (ref 26.0–34.0)
MCHC: 32.9 g/dL (ref 30.0–36.0)
MCV: 87.5 fL (ref 80.0–100.0)
Platelets: 268 10*3/uL (ref 150–400)
RBC: 4.65 MIL/uL (ref 3.87–5.11)
RDW: 11.9 % (ref 11.5–15.5)
WBC: 7.5 10*3/uL (ref 4.0–10.5)
nRBC: 0 % (ref 0.0–0.2)

## 2019-03-26 LAB — URINALYSIS, COMPLETE (UACMP) WITH MICROSCOPIC
Bacteria, UA: NONE SEEN
Bilirubin Urine: NEGATIVE
Glucose, UA: NEGATIVE mg/dL
Hgb urine dipstick: NEGATIVE
Ketones, ur: NEGATIVE mg/dL
Leukocytes,Ua: NEGATIVE
Nitrite: NEGATIVE
Protein, ur: NEGATIVE mg/dL
Specific Gravity, Urine: 1.024 (ref 1.005–1.030)
pH: 5 (ref 5.0–8.0)

## 2019-03-26 LAB — COMPREHENSIVE METABOLIC PANEL
ALT: 40 U/L (ref 0–44)
AST: 23 U/L (ref 15–41)
Albumin: 4.2 g/dL (ref 3.5–5.0)
Alkaline Phosphatase: 74 U/L (ref 38–126)
Anion gap: 12 (ref 5–15)
BUN: 17 mg/dL (ref 6–20)
CO2: 22 mmol/L (ref 22–32)
Calcium: 9.4 mg/dL (ref 8.9–10.3)
Chloride: 105 mmol/L (ref 98–111)
Creatinine, Ser: 0.63 mg/dL (ref 0.44–1.00)
GFR calc Af Amer: >60 mL/min (ref >60)
GFR calc non Af Amer: >60 mL/min (ref >60)
Glucose, Bld: 143 mg/dL — ABNORMAL HIGH (ref 70–99)
Potassium: 4.1 mmol/L (ref 3.5–5.1)
Sodium: 139 mmol/L (ref 135–145)
Total Bilirubin: 0.9 mg/dL (ref 0.3–1.2)
Total Protein: 7.7 g/dL (ref 6.5–8.1)

## 2019-03-26 LAB — POCT PREGNANCY, URINE: Preg Test, Ur: NEGATIVE

## 2019-03-26 MED ORDER — ONDANSETRON 4 MG PO TBDP
4.0000 mg | ORAL_TABLET | Freq: Once | ORAL | Status: AC | PRN
  Administered 2019-03-26: 4 mg via ORAL
  Filled 2019-03-26: qty 1

## 2019-03-26 MED ORDER — KETOROLAC TROMETHAMINE 30 MG/ML IJ SOLN
15.0000 mg | INTRAMUSCULAR | Status: AC
  Administered 2019-03-26: 14:00:00 15 mg via INTRAVENOUS
  Filled 2019-03-26: qty 1

## 2019-03-26 MED ORDER — IOHEXOL 300 MG/ML  SOLN
100.0000 mL | Freq: Once | INTRAMUSCULAR | Status: AC | PRN
  Administered 2019-03-26: 14:00:00 100 mL via INTRAVENOUS

## 2019-03-26 MED ORDER — SODIUM CHLORIDE 0.9 % IV BOLUS
1000.0000 mL | Freq: Once | INTRAVENOUS | Status: AC
  Administered 2019-03-26: 14:00:00 1000 mL via INTRAVENOUS

## 2019-03-26 MED ORDER — OXYCODONE-ACETAMINOPHEN 5-325 MG PO TABS
1.0000 | ORAL_TABLET | ORAL | Status: DC | PRN
  Administered 2019-03-26: 12:00:00 1 via ORAL
  Filled 2019-03-26: qty 1

## 2019-03-26 MED ORDER — KETOROLAC TROMETHAMINE 10 MG PO TABS: 10.0000 mg | ORAL_TABLET | Freq: Four times a day (QID) | ORAL | 0 refills | Status: DC | PRN

## 2019-03-26 MED ORDER — ACETAMINOPHEN 325 MG PO TABS
650.0000 mg | ORAL_TABLET | Freq: Once | ORAL | Status: AC
  Administered 2019-03-26: 17:00:00 650 mg via ORAL
  Filled 2019-03-26: qty 2

## 2019-03-26 MED ORDER — ONDANSETRON 4 MG PO TBDP: 4.0000 mg | ORAL_TABLET | Freq: Three times a day (TID) | ORAL | 0 refills | Status: DC | PRN

## 2019-03-26 NOTE — Progress Notes (Deleted)
Name: Margaret Olson   MRN: [id number]    DOB: Jun 16, 1970   Date:03/26/2019       Progress Note  Chief Complaint  Patient presents with  . Groin Pain    Low right groin pain  . Back Pain    Right lower back pain     Subjective:   Margaret Olson is a 49 y.o. female, presents to clinic for routine follow up on the conditions listed above.  Groin Pain Associated symptoms include abdominal pain, anorexia, back pain and nausea.  Back Pain Associated symptoms include abdominal pain.  Abdominal Pain This is a new problem. The current episode started in the past 7 days. The pain is at a severity of 6/10. The quality of the pain is aching, burning, sharp and dull. Pain radiation: right low back SI joint area to RLQ  Associated symptoms include anorexia and nausea. Associated symptoms comments: malaise.     Patient Active Problem List   Diagnosis Date Noted  . Type 2 diabetes mellitus without complication, without long-term current use of insulin (HCC) 03/26/2018  . Amenorrhea 03/26/2018  . Chronic bilateral low back pain without sciatica 03/26/2018  . H. pylori infection 03/26/2018    Past Surgical History:  Procedure Laterality Date  . TUBAL LIGATION      Family History  Problem Relation Age of Onset  . Hypertension Father   . Diabetes Maternal Grandfather   . Cancer Maternal Grandfather   . Heart attack Paternal Grandmother   . Breast cancer Paternal Grandmother     Social History   Tobacco Use  . Smoking status: Never Smoker  . Smokeless tobacco: Never Used  Substance Use Topics  . Alcohol use: No  . Drug use: No      Current Outpatient Medications:  .  acetaminophen (TYLENOL) 650 MG CR tablet, Take 650 mg by mouth every 8 (eight) hours as needed for pain., Disp: , Rfl:  .  Clotrimazole 1 % OINT, Apply 1 application topically 2 (two) times a day., Disp: 50 g, Rfl: 1  No Known Allergies  Chart Review Today: ***  Review of Systems    Gastrointestinal: Positive for abdominal pain, anorexia and nausea.  Musculoskeletal: Positive for back pain.     Objective:    Vitals:   03/26/19 1031  BP: 112/74  Pulse: 93  Resp: 16  Temp: (!) 97.1 F (36.2 C)  TempSrc: Temporal  SpO2: 97%  Weight: 189 lb 8 oz (86 kg)  Height: 5\' 8"  (1.727 m)    Body mass index is 28.81 kg/m.  Physical Exam    Diabetic Foot Exam: Diabetic Foot Exam - Simple   No data filed      PHQ2/9: Depression screen [facility] 2/9 03/26/2019 07/28/2018 03/25/2018  Decreased Interest 0 0 0  Down, Depressed, Hopeless 0 0 0  PHQ - 2 Score 0 0 0  Altered sleeping 0 0 0  Tired, decreased energy 3 0 0  Change in appetite 1 0 0  Feeling bad or failure about yourself  0 0 0  Trouble concentrating 0 0 0  Moving slowly or fidgety/restless 0 - 0  Suicidal thoughts 0 0 0  PHQ-9 Score 4 0 0  Difficult doing work/chores Somewhat difficult Not difficult at all Not difficult at all    phq 9 is ***  Fall Risk: Fall Risk  03/26/2019 07/28/2018 03/25/2018  Falls in the past year? 0 0 0  Number falls in past yr: 0 0  0  Injury with Fall? 0 0 0    Functional Status Survey: Is the patient deaf or have difficulty hearing?: No Does the patient have difficulty seeing, even when wearing glasses/contacts?: No Does the patient have difficulty concentrating, remembering, or making decisions?: No Does the patient have difficulty walking or climbing stairs?: No Does the patient have difficulty dressing or bathing?: No Does the patient have difficulty doing errands alone such as visiting a doctor's office or shopping?: No   Assessment & Plan:   ***  No follow-ups on file.   Delsa Grana, PA-C 03/26/19 10:56 AM

## 2019-03-26 NOTE — ED Notes (Signed)
Pt ambulated to bathroom, gait steady, denies dizziness

## 2019-03-26 NOTE — ED Notes (Signed)
Pt to US.

## 2019-03-26 NOTE — ED Provider Notes (Signed)
5:14 PM Assumed care for off going team.   Blood pressure (!) 128/50, pulse 72, temperature 97.7 F (36.5 C), temperature source Oral, resp. rate 19, height 5\' 8"  (1.727 m), weight 86 kg, SpO2 98 %.  See their HPI for full report but in brief patient came in with right lower quadrant pain pending ultrasound and most likely discharge home  Ultrasound showed normal appearance of the right ovary.  Unable to visualize left ovary but she is not having any pain there.  Thickened endometrium which I discussed with patient that she should follow-up with her OB/GYN  Reevaluated patient.  Patient's pain was all right lower quadrant.  Patient denied new sexual contacts and vaginal symptoms to suggest PID.  Declined pelvic exam.  Discussed with patient symptomatic treatment.  Patient's not been take anything at home.  Patient now stating the pain is been going on for 1 week and that she is frustrated.  Explained to patient the results of the CT and ultrasound.  We discussed symptomatic management.  Off going Dr. Already prescribed Toradol and Zofran.  Patient also started to use Tylenol.  Patient will follow up with her primary care doctor and return to the ER if symptoms are worsening  I discussed the provisional nature of ED diagnosis, the treatment so far, the ongoing plan of care, follow up appointments and return precautions with the patient and any family or support people present. They expressed understanding and agreed with the plan, discharged home.        , MD 03/26/19 [phone]

## 2019-03-26 NOTE — Patient Instructions (Signed)
Go to the ER for further evaluation  With right lower abdominal pain and with your physical exam its important to rule out appendicitis - though it may be other things as well - like UTI/kidney infection, kidney stone, colon issues, ovary issues.    Please call and let me know if there is anything you need  Do not eat or drink until you get evaluated in the ER - just in case you need surgery

## 2019-03-26 NOTE — ED Triage Notes (Signed)
Pt c/o RLQ pain with nausea for a few days. Sent over from [facility] for possible appendicitis. Tender to touch in RLQ

## 2019-03-26 NOTE — ED Triage Notes (Signed)
Sent from [facility] for possible appendix--has had right lower quad pain with nausea for a few days.

## 2019-03-26 NOTE — ED Provider Notes (Signed)
[facility] Emergency Department Provider Note  ____________________________________________  Time seen: Approximately 3:24 PM  I have reviewed the triage vital signs and the nursing notes.   HISTORY  Chief Complaint Abdominal Pain    HPI Margaret Olson is a 49 y.o. female with a history of diabetes who comes the ED complaining of right lower quadrant abdominal pain, gradual onset, constant, worsening for the past 3 days.  No aggravating or alleviating factors, nonradiating.   Seen by primary care who sent the patient to the ED for evaluation of possible appendicitis.  She also notes she has a history of ovarian cyst.     Past Medical History:  Diagnosis Date  . Diabetes mellitus without complication [facility])      Patient Active Problem List   Diagnosis Date Noted  . Type 2 diabetes mellitus without complication, without long-term current use of insulin (HCC) 03/26/2018  . Amenorrhea 03/26/2018  . Chronic bilateral low back pain without sciatica 03/26/2018  . H. pylori infection 03/26/2018     Past Surgical History:  Procedure Laterality Date  . TUBAL LIGATION       Prior to Admission medications   Medication Sig Start Date End Date Taking? Authorizing Provider  acetaminophen (TYLENOL) 650 MG CR tablet Take 650 mg by mouth every 8 (eight) hours as needed for pain.    [provider]  Clotrimazole 1 % OINT Apply 1 application topically 2 (two) times a day. 07/28/18   Poulose, Percell Belt, NP     Allergies Patient has no known allergies.   Family History  Problem Relation Age of Onset  . Hypertension Father   . Diabetes Maternal Grandfather   . Cancer Maternal Grandfather   . Heart attack Paternal Grandmother   . Breast cancer Paternal Grandmother     Social History Social History   Tobacco Use  . Smoking status: Never Smoker  . Smokeless tobacco: Never Used  Substance Use Topics  . Alcohol use: Yes    Comment: occ  .  Drug use: No    Review of Systems  Constitutional:   No fever or chills.  ENT:   No sore throat. No rhinorrhea. Cardiovascular:   No chest pain or syncope. Respiratory:   No dyspnea or cough. Gastrointestinal:   Positive as above for abdominal pain without vomiting and diarrhea.  Musculoskeletal:   Negative for focal pain or swelling All other systems reviewed and are negative except as documented above in ROS and HPI.  ____________________________________________   PHYSICAL EXAM:  VITAL SIGNS: ED Triage Vitals  Enc Vitals Group     BP 03/26/19 1137 (!) 142/80     Pulse Rate 03/26/19 1137 81     Resp 03/26/19 1137 16     Temp 03/26/19 1137 97.7 F (36.5 C)     Temp Source 03/26/19 1137 Oral     SpO2 03/26/19 1137 99 %     Weight 03/26/19 1138 189 lb 8 oz (86 kg)     Height 03/26/19 1138 5\' 8"  (1.727 m)     Head Circumference --      Peak Flow --      Pain Score 03/26/19 1138 8     Pain Loc --      Pain Edu? --      Excl. in GC? --     Vital signs reviewed, nursing assessments reviewed.   Constitutional:   Alert and oriented. Non-toxic appearance. Eyes:   Conjunctivae are normal. EOMI.  PERRL. ENT      Head:   Normocephalic and atraumatic.      Nose:   Wearing a mask.      Mouth/Throat:   Wearing a mask.      Neck:   No meningismus. Full ROM. Hematological/Lymphatic/Immunilogical:   No cervical lymphadenopathy. Cardiovascular:   RRR. Symmetric bilateral radial and DP pulses.  No murmurs. Cap refill less than 2 seconds. Respiratory:   Normal respiratory effort without tachypnea/retractions. Breath sounds are clear and equal bilaterally. No wheezes/rales/rhonchi. Gastrointestinal:   Soft with focal right lower quadrant tenderness. Non distended. There is no CVA tenderness.  No rebound, rigidity, or guarding. Musculoskeletal:   Normal range of motion in all extremities. No joint effusions.  No lower extremity tenderness.  No edema. Neurologic:   Normal speech and  language.  Motor grossly intact. No acute focal neurologic deficits are appreciated.  Skin:    Skin is warm, dry and intact. No rash noted.  No petechiae, purpura, or bullae.  ____________________________________________    LABS (pertinent positives/negatives) (all labs ordered are listed, but only abnormal results are displayed) Labs Reviewed  COMPREHENSIVE METABOLIC PANEL - Abnormal; Notable for the following components:      Result Value   Glucose, Bld 143 (*)    All other components within normal limits  URINALYSIS, COMPLETE (UACMP) WITH MICROSCOPIC - Abnormal; Notable for the following components:   Color, Urine YELLOW (*)    APPearance HAZY (*)    All other components within normal limits  LIPASE, BLOOD  CBC  POCT PREGNANCY, URINE   ____________________________________________   EKG    ____________________________________________    RADIOLOGY  CT ABDOMEN PELVIS W CONTRAST  Result Date: 03/26/2019 CLINICAL DATA:  Acute right lower quadrant abdominal pain. EXAM: CT ABDOMEN AND PELVIS WITH CONTRAST TECHNIQUE: Multidetector CT imaging of the abdomen and pelvis was performed using the standard protocol following bolus administration of intravenous contrast. CONTRAST:  142mL OMNIPAQUE IOHEXOL 300 MG/ML  SOLN COMPARISON:  None. FINDINGS: Lower chest: No acute abnormality. Hepatobiliary: No focal liver abnormality is seen. No gallstones, gallbladder wall thickening, or biliary dilatation. Pancreas: Unremarkable. No pancreatic ductal dilatation or surrounding inflammatory changes. Spleen: Normal in size without focal abnormality. Adrenals/Urinary Tract: Adrenal glands are unremarkable. Kidneys are normal, without renal calculi, focal lesion, or hydronephrosis. Bladder is unremarkable. Stomach/Bowel: Stomach is within normal limits. Appendix appears normal. No evidence of bowel wall thickening, distention, or inflammatory changes. Vascular/Lymphatic: No significant vascular findings  are present. No enlarged abdominal or pelvic lymph nodes. Reproductive: Uterus and bilateral adnexa are unremarkable. Other: No abdominal wall hernia or abnormality. No abdominopelvic ascites. Musculoskeletal: No acute or significant osseous findings. IMPRESSION: No definite abnormality seen in the abdomen or pelvis. Electronically Signed   By: Marijo Conception M.D.   On: 03/26/2019 14:34    ____________________________________________   PROCEDURES Procedures  ____________________________________________  DIFFERENTIAL DIAGNOSIS   Appendicitis, ovarian torsion, ovarian cyst, dysuria, kidney stone  CLINICAL IMPRESSION / ASSESSMENT AND PLAN / ED COURSE  Medications ordered in the ED: Medications  oxyCODONE-acetaminophen (PERCOCET/ROXICET) 5-325 MG per tablet 1 tablet (1 tablet Oral Given 03/26/19 1145)  ondansetron (ZOFRAN-ODT) disintegrating tablet 4 mg (4 mg Oral Given 03/26/19 1145)  sodium chloride 0.9 % bolus 1,000 mL (1,000 mLs Intravenous New Bag/Given 03/26/19 1404)  ketorolac (TORADOL) 30 MG/ML injection 15 mg (15 mg Intravenous Given 03/26/19 1402)  iohexol (OMNIPAQUE) 300 MG/ML solution 100 mL (100 mLs Intravenous Contrast Given 03/26/19 1413)    Pertinent labs &  imaging results that were available during my care of the patient were reviewed by me and considered in my medical decision making (see chart for details).  Margaret Olson was evaluated in Emergency Department on 03/26/2019 for the symptoms described in the history of present illness. She was evaluated in the context of the global COVID-19 pandemic, which necessitated consideration that the patient might be at risk for infection with the SARS-CoV-2 virus that causes COVID-19. Institutional protocols and algorithms that pertain to the evaluation of patients at risk for COVID-19 are in a state of rapid change based on information released by regulatory bodies including the CDC and federal and state organizations. These policies  and algorithms were followed during the patient's care in the ED.   Patient complains of 3 days of constant pain.  Less likely torsion given that ischemic ovary would likely be nonviable at this point.  Lab panel is unremarkable, pregnancy is negative, vital signs unremarkable.  CT scan obtained which is negative for appendicitis or other acute findings.  On reassessment 3:15 PM, patient still has 3/10 pain in the right lower quadrant with tenderness.  I will obtain a pelvic ultrasound with Dopplers to further assess.  If unremarkable, I would suspect symptoms are due to a ruptured cyst and she can follow-up with her primary care doctor in a few days to continue monitoring symptoms.      ____________________________________________   FINAL CLINICAL IMPRESSION(S) / ED DIAGNOSES    Final diagnoses:  Right lower quadrant abdominal pain  Type 2 diabetes mellitus without complication, without long-term current use of insulin [facility])     ED Discharge Orders    None      Portions of this note were generated with dragon dictation software. Dictation errors may occur despite best attempts at proofreading.   Sharman Cheek, MD 03/26/19 1535

## 2019-03-26 NOTE — Progress Notes (Signed)
Patient ID: Margaret Olson, female    DOB: 1971/01/26, 49 y.o.   MRN: [id number]  PCP: Kerman Passey, MD  Chief Complaint  Patient presents with  . Groin Pain    Low right groin pain  . Back Pain    Right lower back pain    Subjective:   Margaret Olson is a 49 y.o. female, presents to clinic with CC of the following:  Patient presents for development of right lower quadrant pain over the past 5 to 7 days.  She states that it began after she was getting massage that her right low back and SI joint area started be irritated and few days after that pain became much worse feels like it is wrapping around her right pelvis and has become more severe to her right lower quadrant area.  Pain is constant with a dull ache and intermittent sharp shooting and severe pain without any thing that she can find that causes it and she states is randomly much worse, currently is fairly severe rated 7 out of 10.  She has associated anorexia, nausea and malaise just does not feel well.  She denies any fever chills sweats vomiting, diarrhea, dysuria, hematuria, vaginal discharge.  She is having normal bowel movements last bowel movement was this morning.  Last time that she ate something was about 9:00 this morning.  She denies any other aggravating or alleviating factors. She is concerned about her ovaries she is postmenopausal has a history of tubal ligation and no other abdominal or pelvic surgeries in the past.  Urinalysis done here was unremarkable   Patient Active Problem List   Diagnosis Date Noted  . Type 2 diabetes mellitus without complication, without long-term current use of insulin (HCC) 03/26/2018  . Amenorrhea 03/26/2018  . Chronic bilateral low back pain without sciatica 03/26/2018  . H. pylori infection 03/26/2018      Current Outpatient Medications:  .  acetaminophen (TYLENOL) 650 MG CR tablet, Take 650 mg by mouth every 8 (eight) hours as needed for pain., Disp: , Rfl:  .   Clotrimazole 1 % OINT, Apply 1 application topically 2 (two) times a day., Disp: 50 g, Rfl: 1   No Known Allergies   Family History  Problem Relation Age of Onset  . Hypertension Father   . Diabetes Maternal Grandfather   . Cancer Maternal Grandfather   . Heart attack Paternal Grandmother   . Breast cancer Paternal Grandmother      Social History   Socioeconomic History  . Marital status: Married    Spouse name: Chanetta Marshall  . Number of children: 3  . Years of education: 67  . Highest education level: Not on file  Occupational History  . Not on file  Tobacco Use  . Smoking status: Never Smoker  . Smokeless tobacco: Never Used  Substance and Sexual Activity  . Alcohol use: No  . Drug use: No  . Sexual activity: Yes  Other Topics Concern  . Not on file  Social History Narrative  . Not on file   Social Determinants of Health   Financial Resource Strain:   . Difficulty of Paying Living Expenses: Not on file  Food Insecurity:   . Worried About Programme researcher, broadcasting/film/video in the Last Year: Not on file  . Ran Out of Food in the Last Year: Not on file  Transportation Needs:   . Lack of Transportation (Medical): Not on file  . Lack of Transportation (Non-Medical):  Not on file  Physical Activity:   . Days of Exercise per Week: Not on file  . Minutes of Exercise per Session: Not on file  Stress:   . Feeling of Stress : Not on file  Social Connections:   . Frequency of Communication with Friends and Family: Not on file  . Frequency of Social Gatherings with Friends and Family: Not on file  . Attends Religious Services: Not on file  . Active Member of Clubs or Organizations: Not on file  . Attends Banker Meetings: Not on file  . Marital Status: Not on file  Intimate Partner Violence:   . Fear of Current or Ex-Partner: Not on file  . Emotionally Abused: Not on file  . Physically Abused: Not on file  . Sexually Abused: Not on file    Chart Review Today: I  personally reviewed active problem list, medication list, allergies, family history, social history, health maintenance, notes from last encounter, lab results, imaging with the patient/caregiver today.   Review of Systems  10 Systems reviewed and are negative for acute change except as noted in the HPI.     Objective:   Vitals:   03/26/19 1031  BP: 112/74  Pulse: 93  Resp: 16  Temp: (!) 97.1 F (36.2 C)  TempSrc: Temporal  SpO2: 97%  Weight: 189 lb 8 oz (86 kg)  Height: 5\' 8"  (1.727 m)    Body mass index is 28.81 kg/m.  Physical Exam Vitals and nursing note reviewed.  Constitutional:      Appearance: Normal appearance. She is not toxic-appearing or diaphoretic.     Comments: Appears slightly ill and uncomfortable, non-toxic, NAD  Cardiovascular:     Rate and Rhythm: Normal rate and regular rhythm.     Pulses: Normal pulses.     Heart sounds: No murmur. No friction rub. No gallop.   Pulmonary:     Effort: Pulmonary effort is normal. No respiratory distress.     Breath sounds: Normal breath sounds. No wheezing, rhonchi or rales.  Abdominal:     General: Bowel sounds are normal. There is no distension.     Palpations: Abdomen is soft.     Comments: Abdomen soft, nondistended, normal bowel sounds x4 She has tenderness to palpation with guarding to periumbilical area to right lower quadrant with positive McBurney sign, negative Rovsing, negative psoas and negative obturator sign, no CVA tenderness bilaterally  Musculoskeletal:     Comments: No midline tenderness from cervical to lumbar spine and to SI joints bilaterally, no paraspinal muscle tenderness, normal range of motion with bilateral hips with internal and external rotation flexion extension.  Negative straight leg raise bilaterally Normal gait and ambulation getting up from chair and getting on and off the exam table but she is careful with her movement and slightly grimacing  Neurological:     Mental Status: She is  alert.      Results for orders placed or performed in visit on 03/26/19  POCT urinalysis dipstick  Result Value Ref Range   Color, UA Yellow    Clarity, UA Clear    Glucose, UA Negative Negative   Bilirubin, UA Negative    Ketones, UA Negative    Spec Grav, UA 1.025 1.010 - 1.025   Blood, UA Negative    pH, UA 6.0 5.0 - 8.0   Protein, UA Negative Negative   Urobilinogen, UA 0.2 0.2 or 1.0 E.U./dL   Nitrite, UA Negative    Leukocytes, UA Negative  Negative   Appearance Clear    Odor Normal         Assessment & Plan:   49 year old female presents with low back pelvic pain right lower quadrant pain was going to work-up here in the ER office but with fairly significant right lower quadrant tenderness to palpation with guarding, patient in tears after physical exam was done, her urinalysis was unremarkable, we discussed options of getting stat labs and trying to do an outpatient CT to rule out appendicitis versus going to the ER patient prefers to present to the emergency room to rule out appendicitis  She was instructed to remain n.p.o. -call report to the ER .now3     ICD-10-CM   1. Abdominal pain, RLQ  R10.31 POCT urinalysis dipstick    Urine Culture    CANCELED: CBC with Differential/Platelet    CANCELED: COMPLETE METABOLIC PANEL WITH GFR   Pt mcburney's told pt to be NPO, going to ER, r/o appendicitis   2. Acute right-sided low back pain, unspecified whether sciatica present  M54.5         Delsa Grana, PA-C 03/26/19 11:25 AM

## 2019-03-26 NOTE — ED Notes (Signed)
Pt states she is comfortable at present.

## 2019-03-26 NOTE — ED Notes (Signed)
Pt to CT

## 2019-03-26 NOTE — Discharge Instructions (Addendum)
Your CT scan and ultrasound were reassuring.  You did have a thickened endometrium which should be followed up with an OB doctor.  This is unlikely to be what is causing your pain but something that is important to follow-up on.    We have prescribed you some Toradol to take at home.  You can take this with Tylenol 1 g every 8 hours.  Also take Zofran to help with nausea.  You should give this some time and see how things go.  If it is not going away should follow-up with your primary care doctor.  If things are changing you start having fevers or any other concerns she can return to the ER for reevaluation  IMPRESSION: Thick endometrium measuring 22 mm.   Normal appearance of the right ovary.  Left ovary is not visualized.      IMPRESSION:  No definite abnormality seen in the abdomen or pelvis.

## 2019-03-27 ENCOUNTER — Other Ambulatory Visit: Payer: Self-pay | Admitting: Family Medicine

## 2019-03-27 ENCOUNTER — Telehealth: Payer: Self-pay

## 2019-03-27 DIAGNOSIS — M545 Low back pain, unspecified: Secondary | ICD-10-CM

## 2019-03-27 DIAGNOSIS — R1031 Right lower quadrant pain: Secondary | ICD-10-CM

## 2019-03-27 DIAGNOSIS — R9389 Abnormal findings on diagnostic imaging of other specified body structures: Secondary | ICD-10-CM

## 2019-03-27 MED ORDER — PREDNISONE 20 MG PO TABS: 40.0000 mg | ORAL_TABLET | Freq: Every day | ORAL | 0 refills | Status: AC

## 2019-03-27 MED ORDER — TRAMADOL HCL 50 MG PO TABS: 50.0000 mg | ORAL_TABLET | Freq: Three times a day (TID) | ORAL | 0 refills | Status: AC | PRN

## 2019-03-27 NOTE — Telephone Encounter (Signed)
See my note -  but copied below  You can call to explain my plan to pt - let me know if you have questions though  Thanks  LT  Reviewed ER visit and results  Pain meds sent in for pt to use sparingly with other OTC analgesics  Pain started after massage and pain was located to right low back/SI joint area/pelvic - steroids added incase due to muscle or nerve inflammation  GYN referral put in for thickened endometrium  Nurse with call with pt orders/plan etc.   Will want her to f/up in clinic if any worseing.  03/27/19 1:25 PM Danelle Berry, PA-C

## 2019-03-27 NOTE — Telephone Encounter (Signed)
Pt needs referral to gyn per ER, or does she need to be seen again first?

## 2019-03-27 NOTE — Progress Notes (Signed)
Reviewed ER visit and results  Pain meds sent in for pt to use sparingly with other OTC analgesics  Pain started after massage and pain was located to right low back/SI joint area/pelvic - steroids added incase due to muscle or nerve inflammation  GYN referral put in for thickened endometrium  Nurse with call with pt orders/plan etc.   Will want her to f/up in clinic if any worseing.  03/27/19 1:25 PM Danelle Berry, PA-C

## 2019-03-27 NOTE — Telephone Encounter (Signed)
Pt.notified

## 2019-03-27 NOTE — Telephone Encounter (Signed)
Copied from CRM [id number]. Topic: Referral - Request for Referral >> Mar 27, 2019  8:39 AM Reggie Pile, NT wrote: Has patient seen PCP for this complaint? yes *If NO, is insurance requiring patient see PCP for this issue before PCP can refer them? Referral for which specialty: OBGYN Preferred provider/office: one within area/ female Reason for referral: ER recommended OBGYN as uterus wall is thick.

## 2019-03-28 LAB — URINE CULTURE
MICRO NUMBER:: 10192468
Result:: NO GROWTH
SPECIMEN QUALITY:: ADEQUATE

## 2019-03-31 ENCOUNTER — Encounter: Payer: Self-pay | Admitting: Obstetrics & Gynecology

## 2019-04-07 ENCOUNTER — Other Ambulatory Visit: Payer: Self-pay

## 2019-04-07 ENCOUNTER — Encounter: Payer: Self-pay | Admitting: Family Medicine

## 2019-04-07 ENCOUNTER — Ambulatory Visit (INDEPENDENT_AMBULATORY_CARE_PROVIDER_SITE_OTHER): Payer: BLUE CROSS/BLUE SHIELD | Admitting: Family Medicine

## 2019-04-07 VITALS — BP 122/78 | HR 92 | Temp 97.8°F | Resp 14 | Ht 68.0 in | Wt 192.7 lb

## 2019-04-07 DIAGNOSIS — R21 Rash and other nonspecific skin eruption: Secondary | ICD-10-CM

## 2019-04-07 DIAGNOSIS — E119 Type 2 diabetes mellitus without complications: Secondary | ICD-10-CM | POA: Diagnosis not present

## 2019-04-07 DIAGNOSIS — E785 Hyperlipidemia, unspecified: Secondary | ICD-10-CM

## 2019-04-07 DIAGNOSIS — B359 Dermatophytosis, unspecified: Secondary | ICD-10-CM

## 2019-04-07 DIAGNOSIS — R1084 Generalized abdominal pain: Secondary | ICD-10-CM | POA: Diagnosis not present

## 2019-04-07 DIAGNOSIS — Z09 Encounter for follow-up examination after completed treatment for conditions other than malignant neoplasm: Secondary | ICD-10-CM

## 2019-04-07 DIAGNOSIS — Z0000 Encounter for general adult medical examination without abnormal findings: Secondary | ICD-10-CM

## 2019-04-07 MED ORDER — DICYCLOMINE HCL 20 MG PO TABS: 20.0000 mg | ORAL_TABLET | Freq: Three times a day (TID) | ORAL | 1 refills | Status: DC | PRN

## 2019-04-07 MED ORDER — PANTOPRAZOLE SODIUM 40 MG PO TBEC: 40.0000 mg | DELAYED_RELEASE_TABLET | Freq: Every day | ORAL | 1 refills | Status: DC

## 2019-04-07 MED ORDER — CLOTRIMAZOLE 1 % EX OINT: 1.0000 "application " | TOPICAL_OINTMENT | Freq: Two times a day (BID) | CUTANEOUS | 1 refills | Status: DC | PRN

## 2019-04-07 NOTE — Progress Notes (Deleted)
Patient: Margaret Olson, Female    DOB: January 26, 1971, 49 y.o.   MRN: [id number] Delsa Grana, PA-C Visit Date: 04/07/2019  Today's Provider: Delsa Grana, PA-C   Chief Complaint  Patient presents with  . Follow-up  . Fatigue    loss of energy   Subjective:   Annual physical exam:  Margaret Olson is a 49 y.o. female who presents today for complete physical exam:  Exercise/Activity:  Diet/nutrition:  Sleep:   Pt wished to discuss acute complaints *** do routine f/up on chronic conditions today in addition to CPE. Advised pt of separate visit billing/coding  USPSTF grade A and B recommendations - reviewed and addressed today  Depression:  Phq 9 completed today by patient, was reviewed by me with patient in the room PHQ score is ***, pt feels *** PHQ 2/9 Scores 04/07/2019 03/26/2019 07/28/2018 03/25/2018  PHQ - 2 Score 0 0 0 0  PHQ- 9 Score 3 4 0 0   Depression screen [facility] 2/9 04/07/2019 03/26/2019 07/28/2018 03/25/2018  Decreased Interest 0 0 0 0  Down, Depressed, Hopeless 0 0 0 0  PHQ - 2 Score 0 0 0 0  Altered sleeping 0 0 0 0  Tired, decreased energy 3 3 0 0  Change in appetite 0 1 0 0  Feeling bad or failure about yourself  0 0 0 0  Trouble concentrating 0 0 0 0  Moving slowly or fidgety/restless 0 0 - 0  Suicidal thoughts 0 0 0 0  PHQ-9 Score 3 4 0 0  Difficult doing work/chores - Somewhat difficult Not difficult at all Not difficult at all    Alcohol screening:   Office Visit from 04/07/2019 in [facility]  AUDIT-C Score  0      Immunizations and Health Maintenance: Health Maintenance  Topic Date Due  . PNEUMOCOCCAL POLYSACCHARIDE VACCINE AGE 66-64 HIGH RISK  02/18/1972  . MAMMOGRAM  02/18/1988  . PAP SMEAR-Modifier  02/18/1991  . HEMOGLOBIN A1C  09/23/2018  . FOOT EXAM  03/26/2019  . URINE MICROALBUMIN  03/26/2019  . INFLUENZA VACCINE  04/29/2019 (Originally 08/30/2018)  . OPHTHALMOLOGY EXAM  07/30/2019  . TETANUS/TDAP  06/30/2027  .  HIV Screening  Completed     Hep C Screening: ***  STD testing and prevention (HIV/chl/gon/syphilis):  see above, no additional testing desired by pt today  Intimate partner violence:***  Sexual History/Pain during Intercourse: Married  Menstrual History/LMP/Abnormal Bleeding: *** No LMP recorded. Patient is postmenopausal.  Incontinence Symptoms: ***  Breast cancer:  Last Mammogram: *see HM list above BRCA gene screening: ***  Cervical cancer screening: *** Pt *** family hx of cancers - breast, ovarian, uterine, colon:     Osteoporosis:   Discussion on osteoporosis per age, including high calcium and vitamin D supplementation, weight bearing exercises Pt is *** supplementing with daily calcium/Vit D. *** Bone scan/dexa Roughly experienced menopause at age ***  Skin cancer:  Hx of skin CA -  NO*** Discussed atypical lesions   Colorectal cancer:   Colonoscopy is ***   Discussed concerning signs and sx of CRC, pt denies ***  Lung cancer:   Low Dose CT Chest recommended if Age 27-80 years, 30 pack-year currently smoking OR have quit w/in 15years. Patient {DOES NOT does:27190::"does not"} qualify.    Social History   Tobacco Use  . Smoking status: Never Smoker  . Smokeless tobacco: Never Used  Substance Use Topics  . Alcohol use: Yes    Comment: occ  .  Drug use: No       Office Visit from 04/07/2019 in [facility]  AUDIT-C Score  0      Family History  Problem Relation Age of Onset  . Hypertension Father   . Diabetes Maternal Grandfather   . Cancer Maternal Grandfather   . Heart attack Paternal Grandmother   . Breast cancer Paternal Grandmother      Blood pressure/Hypertension: BP Readings from Last 3 Encounters:  04/07/19 122/78  03/26/19 (!) 128/50  03/26/19 112/74    Weight/Obesity: Wt Readings from Last 3 Encounters:  04/07/19 192 lb 11.2 oz (87.4 kg)  03/26/19 189 lb 8 oz (86 kg)  03/26/19 189 lb 8 oz (86 kg)   BMI  Readings from Last 3 Encounters:  04/07/19 29.30 kg/m  03/26/19 28.81 kg/m  03/26/19 28.81 kg/m     Lipids:  Lab Results  Component Value Date   CHOL 199 03/25/2018   Lab Results  Component Value Date   HDL 67 03/25/2018   Lab Results  Component Value Date   LDLCALC 114 (H) 03/25/2018   Lab Results  Component Value Date   TRIG 85 03/25/2018   Lab Results  Component Value Date   CHOLHDL 3.0 03/25/2018   No results found for: LDLDIRECT Based on the results of lipid panel his/her cardiovascular risk factor ( using [place] )  in the next 10 years is: The 10-year ASCVD risk score Mikey Bussing DC Brooke Bonito., et al., 2013) is: 1.5%   Values used to calculate the score:     Age: 30 years     Sex: Female     Is Non-Hispanic African American: No     Diabetic: Yes     Tobacco smoker: No     Systolic Blood Pressure: 382 mmHg     Is BP treated: No     HDL Cholesterol: 67 mg/dL     Total Cholesterol: 199 mg/dL Glucose:  Glucose  Date Value Ref Range Status  07/11/2011 89 65 - 99 mg/dL Final   Glucose, Bld  Date Value Ref Range Status  03/26/2019 143 (H) 70 - 99 mg/dL Final    Comment:    Glucose reference range applies only to samples taken after fasting for at least 8 hours.  07/28/2018 122 (H) 65 - 99 mg/dL Final    Comment:    .            Fasting reference interval . For someone without known diabetes, a glucose value between 100 and 125 mg/dL is consistent with prediabetes and should be confirmed with a follow-up test. .   03/25/2018 94 65 - 99 mg/dL Final    Comment:    .            Fasting reference interval .    Hypertension: BP Readings from Last 3 Encounters:  04/07/19 122/78  03/26/19 (!) 128/50  03/26/19 112/74   Obesity: Wt Readings from Last 3 Encounters:  04/07/19 192 lb 11.2 oz (87.4 kg)  03/26/19 189 lb 8 oz (86 kg)  03/26/19 189 lb 8 oz (86 kg)   BMI Readings from Last 3 Encounters:  04/07/19 29.30 kg/m  03/26/19 28.81 kg/m  03/26/19  28.81 kg/m      Advanced Care Planning:  A voluntary discussion about advance care planning including the explanation and discussion of advance directives.   Discussed health care proxy and Living will, and the patient was able to identify a health care proxy as ***.  Patient {DOES_DOES MVE:72094} have a living will at present time.   Social History      She        Social History   Socioeconomic History  . Marital status: Married    Spouse name: Laverna Peace  . Number of children: 3  . Years of education: 60  . Highest education level: Not on file  Occupational History  . Not on file  Tobacco Use  . Smoking status: Never Smoker  . Smokeless tobacco: Never Used  Substance and Sexual Activity  . Alcohol use: Yes    Comment: occ  . Drug use: No  . Sexual activity: Yes  Other Topics Concern  . Not on file  Social History Narrative  . Not on file   Social Determinants of Health   Financial Resource Strain:   . Difficulty of Paying Living Expenses: Not on file  Food Insecurity:   . Worried About Charity fundraiser in the Last Year: Not on file  . Ran Out of Food in the Last Year: Not on file  Transportation Needs:   . Lack of Transportation (Medical): Not on file  . Lack of Transportation (Non-Medical): Not on file  Physical Activity:   . Days of Exercise per Week: Not on file  . Minutes of Exercise per Session: Not on file  Stress:   . Feeling of Stress : Not on file  Social Connections:   . Frequency of Communication with Friends and Family: Not on file  . Frequency of Social Gatherings with Friends and Family: Not on file  . Attends Religious Services: Not on file  . Active Member of Clubs or Organizations: Not on file  . Attends Archivist Meetings: Not on file  . Marital Status: Not on file    Family History        Family History  Problem Relation Age of Onset  . Hypertension Father   . Diabetes Maternal Grandfather   . Cancer Maternal Grandfather    . Heart attack Paternal Grandmother   . Breast cancer Paternal Grandmother     Patient Active Problem List   Diagnosis Date Noted  . Type 2 diabetes mellitus without complication, without long-term current use of insulin ([place]) 03/26/2018  . Amenorrhea 03/26/2018  . Chronic bilateral low back pain without sciatica 03/26/2018  . H. pylori infection 03/26/2018    Past Surgical History:  Procedure Laterality Date  . ENDOMETRIAL BIOPSY    . TUBAL LIGATION       Current Outpatient Medications:  .  acetaminophen (TYLENOL) 650 MG CR tablet, Take 650 mg by mouth every 8 (eight) hours as needed for pain., Disp: , Rfl:  .  Clotrimazole 1 % OINT, Apply 1 application topically 2 (two) times a day. (Patient not taking: Reported on 04/07/2019), Disp: 50 g, Rfl: 1 .  ketorolac (TORADOL) 10 MG tablet, Take 1 tablet (10 mg total) by mouth every 6 (six) hours as needed for moderate pain. (Patient not taking: Reported on 04/07/2019), Disp: 12 tablet, Rfl: 0 .  ondansetron (ZOFRAN ODT) 4 MG disintegrating tablet, Take 1 tablet (4 mg total) by mouth every 8 (eight) hours as needed for nausea or vomiting. (Patient not taking: Reported on 04/07/2019), Disp: 20 tablet, Rfl: 0  No Known Allergies  Patient Care Team: Delsa Grana, PA-C as PCP - General (Family Medicine)  Review of Systems   ***       Objective:   Vitals:  Vitals:   04/07/19  0932  BP: 122/78  Pulse: 92  Resp: 14  Temp: 97.8 F (36.6 C)  SpO2: 96%  Weight: 192 lb 11.2 oz (87.4 kg)  Height: 5' 8"  (1.727 m)    Body mass index is 29.3 kg/m.  Physical Exam    Fall Risk: Fall Risk  04/07/2019 03/26/2019 07/28/2018 03/25/2018  Falls in the past year? 0 0 0 0  Number falls in past yr: 0 0 0 0  Injury with Fall? 0 0 0 0    Functional Status Survey: Is the patient deaf or have difficulty hearing?: No Does the patient have difficulty seeing, even when wearing glasses/contacts?: No Does the patient have difficulty concentrating,  remembering, or making decisions?: No Does the patient have difficulty walking or climbing stairs?: No Does the patient have difficulty dressing or bathing?: No Does the patient have difficulty doing errands alone such as visiting a doctor's office or shopping?: No   Assessment & Plan:    CPE completed today  . USPSTF grade A and B recommendations reviewed with patient; age-appropriate recommendations, preventive care, screening tests, etc discussed and encouraged; healthy living encouraged; see AVS for patient education given to patient  . Discussed importance of 150 minutes of physical activity weekly, AHA exercise recommendations given to pt in AVS/handout  . Discussed importance of healthy diet:  eating lean meats and proteins, avoiding trans fats and saturated fats, avoid simple sugars and excessive carbs in diet, eat 6 servings of fruit/vegetables daily and drink plenty of water and avoid sweet beverages.    . Recommended pt to do annual eye exam and routine dental exams/cleanings  . Depression, alcohol, fall screening completed as documented above and per flowsheets  . Reviewed Health Maintenance: Health Maintenance  Topic Date Due  . PNEUMOCOCCAL POLYSACCHARIDE VACCINE AGE 49-64 HIGH RISK  02/18/1972  . MAMMOGRAM  02/18/1988  . PAP SMEAR-Modifier  02/18/1991  . HEMOGLOBIN A1C  09/23/2018  . FOOT EXAM  03/26/2019  . URINE MICROALBUMIN  03/26/2019  . INFLUENZA VACCINE  04/29/2019 (Originally 08/30/2018)  . OPHTHALMOLOGY EXAM  07/30/2019  . TETANUS/TDAP  06/30/2027  . HIV Screening  Completed    . Immunizations: Immunization History  Administered Date(s) Administered  . Influenza,inj,Quad PF,6+ Mos 03/25/2018    ***  No orders of the defined types were placed in this encounter.       Delsa Grana, PA-C 04/07/19 9:57 AM  [facility]

## 2019-04-07 NOTE — Patient Instructions (Signed)
Take protonix once every morning for the next 2-3 weeks - before eating breakfast and do not take with other meds or supplements  Can use bentyl as needed for any GI upset, indigestion, bloating, cramping  We'll follow up in 1-2 months and do your physical then, let me know if you need f/up sooner

## 2019-04-07 NOTE — Progress Notes (Signed)
Patient ID: Margaret Olson, female    DOB: December 10, 1970, 49 y.o.   MRN: [id number]  PCP: Danelle Berry, PA-C  Chief Complaint  Patient presents with  . Follow-up  . Fatigue    loss of energy    Subjective:   Margaret Olson is a 49 y.o. female, presents to clinic with CC of the following:  HPI  Patient presents for follow-up after recent visit for abdominal pain she had severe tenderness to right lower quadrant she was referred to the ER to rule out appendicitis she also had some episode low back pelvic pain without any association with hip manipulation was unsure if it was sciatica or SI joint or a controlled pelvic muscle skeletal pain.  She had evaluation in the hospital and had a negative pelvic ultrasound and negative CT scan only thing they found was a thickened endometrial lining which she has since followed up with OB/GYN.  Last week they did a endometrial biopsy which was negative she says.  This visit was originally scheduled for her physical with Pap however we will push that back since we just did her blood work about a week or 2 ago and she just had a biopsy.  Patient does wish to reschedule.  She endorses fatigue, weight gain.  She would like to have labs rechecked to rule out other sources of possible fatigue.  She also has improved right lower quadrant abdominal pain but does seem to have more epigastric and upper abdominal pain indigestion and nausea fullness after eating.  She was treated with steroids and she felt that her energy was significantly improved while on the steroids and she felt as if she crashed again when steroids were completed.  Steroids also unfortunately did lead to some epigastric irritation and indigestion.   Does have a history of H. pylori in the past that was treated with quadruple therapy.    Patient Active Problem List   Diagnosis Date Noted  . Type 2 diabetes mellitus without complication, without long-term current use of insulin (HCC)  03/26/2018  . Amenorrhea 03/26/2018  . Chronic bilateral low back pain without sciatica 03/26/2018  . H. pylori infection 03/26/2018      Current Outpatient Medications:  .  acetaminophen (TYLENOL) 650 MG CR tablet, Take 650 mg by mouth every 8 (eight) hours as needed for pain., Disp: , Rfl:  .  Clotrimazole 1 % OINT, Apply 1 application topically 2 (two) times daily as needed., Disp: 50 g, Rfl: 1 .  dicyclomine (BENTYL) 20 MG tablet, Take 1 tablet (20 mg total) by mouth 3 (three) times daily as needed for spasms (abd cramping)., Disp: 30 tablet, Rfl: 1 .  ketorolac (TORADOL) 10 MG tablet, Take 1 tablet (10 mg total) by mouth every 6 (six) hours as needed for moderate pain. (Patient not taking: Reported on 04/07/2019), Disp: 12 tablet, Rfl: 0 .  pantoprazole (PROTONIX) 40 MG tablet, Take 1 tablet (40 mg total) by mouth daily., Disp: 30 tablet, Rfl: 1   No Known Allergies   Family History  Problem Relation Age of Onset  . Hypertension Father   . Diabetes Maternal Grandfather   . Cancer Maternal Grandfather   . Heart attack Paternal Grandmother   . Breast cancer Paternal Grandmother      Social History   Socioeconomic History  . Marital status: Married    Spouse name: Chanetta Marshall  . Number of children: 3  . Years of education: 43  . Highest education level:  Not on file  Occupational History  . Not on file  Tobacco Use  . Smoking status: Never Smoker  . Smokeless tobacco: Never Used  Substance and Sexual Activity  . Alcohol use: Yes    Comment: occ  . Drug use: No  . Sexual activity: Yes  Other Topics Concern  . Not on file  Social History Narrative  . Not on file   Social Determinants of Health   Financial Resource Strain:   . Difficulty of Paying Living Expenses: Not on file  Food Insecurity:   . Worried About Programme researcher, broadcasting/film/video in the Last Year: Not on file  . Ran Out of Food in the Last Year: Not on file  Transportation Needs:   . Lack of Transportation  (Medical): Not on file  . Lack of Transportation (Non-Medical): Not on file  Physical Activity:   . Days of Exercise per Week: Not on file  . Minutes of Exercise per Session: Not on file  Stress:   . Feeling of Stress : Not on file  Social Connections:   . Frequency of Communication with Friends and Family: Not on file  . Frequency of Social Gatherings with Friends and Family: Not on file  . Attends Religious Services: Not on file  . Active Member of Clubs or Organizations: Not on file  . Attends Banker Meetings: Not on file  . Marital Status: Not on file  Intimate Partner Violence:   . Fear of Current or Ex-Partner: Not on file  . Emotionally Abused: Not on file  . Physically Abused: Not on file  . Sexually Abused: Not on file    Chart Review Today: I personally reviewed active problem list, medication list, allergies, family history, social history, health maintenance, notes from last encounter, lab results, imaging with the patient/caregiver today. Did review all of her past labs done at the ER, all of her imaging done in the ER and subsequent ER visit at [facility] reviewed through care everywhere.  Additionally reviewed OB/GYN notes and results  Review of Systems  Constitutional: Negative.   HENT: Negative.   Eyes: Negative.   Respiratory: Negative.   Cardiovascular: Negative.   Gastrointestinal: Negative.   Endocrine: Negative.   Genitourinary: Negative.   Musculoskeletal: Negative.   Skin: Negative.   Allergic/Immunologic: Negative.   Neurological: Negative.   Hematological: Negative.   Psychiatric/Behavioral: Negative.   All other systems reviewed and are negative.      Objective:   Vitals:   04/07/19 0932  BP: 122/78  Pulse: 92  Resp: 14  Temp: 97.8 F (36.6 C)  SpO2: 96%  Weight: 192 lb 11.2 oz (87.4 kg)  Height: 5\' 8"  (1.727 m)    Body mass index is 29.3 kg/m.  Physical Exam Vitals and nursing note reviewed.  Constitutional:       General: She is not in acute distress.    Appearance: Normal appearance. She is well-developed. She is not ill-appearing, toxic-appearing or diaphoretic.     Interventions: Face mask in place.  HENT:     Head: Normocephalic and atraumatic.     Right Ear: External ear normal.     Left Ear: External ear normal.  Eyes:     General: Lids are normal. No scleral icterus.       Right eye: No discharge.        Left eye: No discharge.     Conjunctiva/sclera: Conjunctivae normal.  Neck:     Trachea: Phonation normal. No  tracheal deviation.  Cardiovascular:     Rate and Rhythm: Normal rate and regular rhythm.     Pulses: Normal pulses.          Radial pulses are 2+ on the right side and 2+ on the left side.       Posterior tibial pulses are 2+ on the right side and 2+ on the left side.     Heart sounds: Normal heart sounds. No murmur. No friction rub. No gallop.   Pulmonary:     Effort: Pulmonary effort is normal. No respiratory distress.     Breath sounds: Normal breath sounds. No stridor. No wheezing, rhonchi or rales.  Chest:     Chest wall: No tenderness.  Abdominal:     General: Bowel sounds are normal. There is no distension.     Palpations: Abdomen is soft.     Tenderness: There is no abdominal tenderness. There is no right CVA tenderness or left CVA tenderness.  Musculoskeletal:        General: No deformity. Normal range of motion.     Cervical back: Normal range of motion and neck supple.     Right lower leg: No edema.     Left lower leg: No edema.  Lymphadenopathy:     Cervical: No cervical adenopathy.  Skin:    General: Skin is warm and dry.     Capillary Refill: Capillary refill takes less than 2 seconds.     Coloration: Skin is not jaundiced or pale.     Findings: No rash.  Neurological:     Mental Status: She is alert and oriented to person, place, and time.     Motor: No abnormal muscle tone.     Gait: Gait normal.  Psychiatric:        Speech: Speech normal.         Behavior: Behavior normal.      Results for orders placed or performed during the hospital encounter of 03/26/19  Lipase, blood  Result Value Ref Range   Lipase 25 11 - 51 U/L  Comprehensive metabolic panel  Result Value Ref Range   Sodium 139 135 - 145 mmol/L   Potassium 4.1 3.5 - 5.1 mmol/L   Chloride 105 98 - 111 mmol/L   CO2 22 22 - 32 mmol/L   Glucose, Bld 143 (H) 70 - 99 mg/dL   BUN 17 6 - 20 mg/dL   Creatinine, Ser 0.63 0.44 - 1.00 mg/dL   Calcium 9.4 8.9 - 10.3 mg/dL   Total Protein 7.7 6.5 - 8.1 g/dL   Albumin 4.2 3.5 - 5.0 g/dL   AST 23 15 - 41 U/L   ALT 40 0 - 44 U/L   Alkaline Phosphatase 74 38 - 126 U/L   Total Bilirubin 0.9 0.3 - 1.2 mg/dL   GFR calc non Af Amer >60 >60 mL/min   GFR calc Af Amer >60 >60 mL/min   Anion gap 12 5 - 15  CBC  Result Value Ref Range   WBC 7.5 4.0 - 10.5 K/uL   RBC 4.65 3.87 - 5.11 MIL/uL   Hemoglobin 13.4 12.0 - 15.0 g/dL   HCT 40.7 36.0 - 46.0 %   MCV 87.5 80.0 - 100.0 fL   MCH 28.8 26.0 - 34.0 pg   MCHC 32.9 30.0 - 36.0 g/dL   RDW 11.9 11.5 - 15.5 %   Platelets 268 150 - 400 K/uL   nRBC 0.0 0.0 - 0.2 %  Urinalysis, Complete  w Microscopic  Result Value Ref Range   Color, Urine YELLOW (A) YELLOW   APPearance HAZY (A) CLEAR   Specific Gravity, Urine 1.024 1.005 - 1.030   pH 5.0 5.0 - 8.0   Glucose, UA NEGATIVE NEGATIVE mg/dL   Hgb urine dipstick NEGATIVE NEGATIVE   Bilirubin Urine NEGATIVE NEGATIVE   Ketones, ur NEGATIVE NEGATIVE mg/dL   Protein, ur NEGATIVE NEGATIVE mg/dL   Nitrite NEGATIVE NEGATIVE   Leukocytes,Ua NEGATIVE NEGATIVE   RBC / HPF 0-5 0 - 5 RBC/hpf   WBC, UA 0-5 0 - 5 WBC/hpf   Bacteria, UA NONE SEEN NONE SEEN   Squamous Epithelial / LPF 0-5 0 - 5   Mucus PRESENT   Pregnancy, urine POC  Result Value Ref Range   Preg Test, Ur NEGATIVE NEGATIVE        Assessment & Plan:      ICD-10-CM   1. Generalized abdominal pain  R10.84 pantoprazole (PROTONIX) 40 MG tablet    dicyclomine (BENTYL) 20 MG  tablet   f/up from ER results at [facility], she saw OBGYN for thickened endometrium, some improvement with pain but not yet resolved, tx PPI reglan/bentyl prn  2. Rash of hands  R21    refill requested on old Rx that helped with rash to hands, recurrent esp with exposure to soaps/detergents  3. Type 2 diabetes mellitus without complication, without long-term current use of insulin (HCC)  E11.9 COMPLETE METABOLIC PANEL WITH GFR    Hemoglobin A1c    CANCELED: Pneumococcal polysaccharide vaccine 23-valent greater than or equal to 2yo subcutaneous/IM   hx of T2DM not on med, labs ordered for her future f/up visit and physical  4. Hyperlipidemia, unspecified hyperlipidemia type  E78.5 COMPLETE METABOLIC PANEL WITH GFR    Lipid panel   hx of HLD, not on meds, recheck labs today - will further review and address at her rescheduled physical  5. Tinea  B35.9 Clotrimazole 1 % OINT  6. Adult general medical exam  Z00.00 CBC with Differential/Platelet    COMPLETE METABOLIC PANEL WITH GFR    Lipid panel    Hemoglobin A1c    TSH   Pt will return for CPE and labs  7. Encounter for examination following treatment at hospital  Z09         Danelle Berry, PA-C 04/07/19 10:06 AM

## 2019-04-15 ENCOUNTER — Telehealth: Payer: Self-pay | Admitting: Family Medicine

## 2019-04-15 DIAGNOSIS — R21 Rash and other nonspecific skin eruption: Secondary | ICD-10-CM

## 2019-04-15 MED ORDER — TRIAMCINOLONE ACETONIDE 0.1 % EX OINT: 1.0000 "application " | TOPICAL_OINTMENT | Freq: Two times a day (BID) | CUTANEOUS | 1 refills | Status: DC | PRN

## 2019-04-15 NOTE — Telephone Encounter (Signed)
Patient states Clotrimazole 1 % OINT is not working and would like steriod cream that PCP recommended at the 04/07/2019 appointment regarding "hand cream"   patient states upon reading instructions for dicyclomine (BENTYL) 20 MG tablet and pantoprazole (PROTONIX) 40 MG tablet it states medication should not be taken with "anti-acid" medication.   Patient would like a follow up call, please advise  [facility] [address]), [place] - [address] Phone:  [phone]  Fax:  [phone]

## 2019-04-15 NOTE — Telephone Encounter (Signed)
Pt.notified

## 2019-06-03 ENCOUNTER — Telehealth: Payer: Self-pay

## 2019-06-03 NOTE — Telephone Encounter (Signed)
Please see what medication she is taking and which she has discontinued? She can also review medications and click meds off for removal when she has stopped taking them.  Appreciate the phone call but not enough info to update chart.  I saw she was seeing [facility] for endometrial evaluation. Please ask for me if her other GI sx are improved?  We hope she is doing well.  Thanks,  Danelle Berry, PA-C

## 2019-06-03 NOTE — Telephone Encounter (Signed)
Copied from CRM [id number]. Topic: General - Inquiry >> Jun 03, 2019 12:55 PM Margaret Olson wrote: Reason for CRM: Patient is currently on a medication for stomach issues that she doesn't want to continue taking and will stop . Wanted to inform Dr. Angelica Chessman

## 2019-06-04 NOTE — Telephone Encounter (Signed)
Called did not answer vm not setup.  Im assuming she is talking about the Protonix.

## 2019-06-08 ENCOUNTER — Encounter: Payer: BLUE CROSS/BLUE SHIELD | Admitting: Family Medicine

## 2019-07-05 ENCOUNTER — Other Ambulatory Visit: Payer: Self-pay

## 2019-07-05 ENCOUNTER — Emergency Department
Admission: EM | Admit: 2019-07-05 | Discharge: 2019-07-05 | Disposition: A | Discharge status: Home / Self Care | Payer: BLUE CROSS/BLUE SHIELD | Attending: Emergency Medicine | Admitting: Emergency Medicine

## 2019-07-05 DIAGNOSIS — Z20822 Contact with and (suspected) exposure to covid-19: Secondary | ICD-10-CM | POA: Diagnosis not present

## 2019-07-05 DIAGNOSIS — R519 Headache, unspecified: Secondary | ICD-10-CM | POA: Diagnosis present

## 2019-07-05 DIAGNOSIS — J329 Chronic sinusitis, unspecified: Secondary | ICD-10-CM | POA: Diagnosis not present

## 2019-07-05 DIAGNOSIS — J029 Acute pharyngitis, unspecified: Secondary | ICD-10-CM | POA: Diagnosis not present

## 2019-07-05 DIAGNOSIS — R05 Cough: Secondary | ICD-10-CM | POA: Insufficient documentation

## 2019-07-05 DIAGNOSIS — B9789 Other viral agents as the cause of diseases classified elsewhere: Secondary | ICD-10-CM | POA: Diagnosis not present

## 2019-07-05 DIAGNOSIS — E119 Type 2 diabetes mellitus without complications: Secondary | ICD-10-CM | POA: Insufficient documentation

## 2019-07-05 DIAGNOSIS — R438 Other disturbances of smell and taste: Secondary | ICD-10-CM | POA: Diagnosis not present

## 2019-07-05 DIAGNOSIS — R0981 Nasal congestion: Secondary | ICD-10-CM | POA: Diagnosis not present

## 2019-07-05 LAB — SARS CORONAVIRUS 2 (TAT 6-24 HRS): SARS Coronavirus 2: NEGATIVE

## 2019-07-05 MED ORDER — DEXAMETHASONE SODIUM PHOSPHATE 10 MG/ML IJ SOLN
10.0000 mg | Freq: Once | INTRAMUSCULAR | Status: AC
  Administered 2019-07-05: 10 mg via INTRAMUSCULAR
  Filled 2019-07-05: qty 1

## 2019-07-05 MED ORDER — AMOXICILLIN-POT CLAVULANATE 875-125 MG PO TABS: 1.0000 | ORAL_TABLET | Freq: Two times a day (BID) | ORAL | 0 refills | Status: AC

## 2019-07-05 MED ORDER — BENZONATATE 100 MG PO CAPS: 100.0000 mg | ORAL_CAPSULE | Freq: Three times a day (TID) | ORAL | 0 refills | Status: DC | PRN

## 2019-07-05 NOTE — ED Triage Notes (Signed)
Pt comes POV with possible sinus infection. Congestion, pressure in sinuses, body aches since Tuesday night.

## 2019-07-05 NOTE — ED Notes (Signed)
Pt presents to the ED for sinus pressure around the eyes, headache, cough, and congestion. Pt states that this has been going on since Thursday evening. Pt denies fever at home has been trying to take Alkesetzer with no relief.

## 2019-07-05 NOTE — ED Provider Notes (Addendum)
[facility] Emergency Department Provider Note ____________________________________________  Time seen: 1140  I have reviewed the triage vital signs and the nursing notes.  HISTORY  Chief Complaint  Recurrent Sinusitis   HPI DEMARIE Olson is a 49 y.o. female presents to the ER today with complaint of headache, right-sided facial pressure, nasal congestion, sore throat and cough.  She reports this started 6 days ago.  The headache is located in her forehead and behind her eyes.  She describes the pain as pressure.  She denies dizziness or visual changes.  She is blowing clear mucus out of her nose.  She is having some difficulty swallowing.  She has lost her taste of smell.  The cough is mostly nonproductive.  She denies fever or chills but has had body aches.  She denies eye pain, eye redness, discharge, ear pain, loss of taste or shortness of breath.  She does not smoke.  She has not had sick contacts or exposure to Covid that she is aware of.  She has tried Alka-Seltzer OTC with minimal relief of symptoms.  Past Medical History:  Diagnosis Date  . Diabetes mellitus without complication [facility])     Patient Active Problem List   Diagnosis Date Noted  . Type 2 diabetes mellitus without complication, without long-term current use of insulin (HCC) 03/26/2018  . Amenorrhea 03/26/2018  . Chronic bilateral low back pain without sciatica 03/26/2018  . H. pylori infection 03/26/2018    Past Surgical History:  Procedure Laterality Date  . ENDOMETRIAL BIOPSY    . TUBAL LIGATION      Prior to Admission medications   Medication Sig Start Date End Date Taking? Authorizing Provider  acetaminophen (TYLENOL) 650 MG CR tablet Take 650 mg by mouth every 8 (eight) hours as needed for pain.    [provider]  amoxicillin-clavulanate (AUGMENTIN) 875-125 MG tablet Take 1 tablet by mouth 2 (two) times daily for 10 days. 07/05/19 07/15/19  Lorre Munroe, NP   benzonatate (TESSALON PERLES) 100 MG capsule Take 1 capsule (100 mg total) by mouth 3 (three) times daily as needed for cough. 07/05/19 07/04/20  Lorre Munroe, NP  Clotrimazole 1 % OINT Apply 1 application topically 2 (two) times daily as needed. 04/07/19   Danelle Berry, PA-C  dicyclomine (BENTYL) 20 MG tablet Take 1 tablet (20 mg total) by mouth 3 (three) times daily as needed for spasms (abd cramping). 04/07/19   Danelle Berry, PA-C  ketorolac (TORADOL) 10 MG tablet Take 1 tablet (10 mg total) by mouth every 6 (six) hours as needed for moderate pain. Patient not taking: Reported on 04/07/2019 03/26/19   Sharman Cheek, MD  pantoprazole (PROTONIX) 40 MG tablet Take 1 tablet (40 mg total) by mouth daily. 04/07/19   Danelle Berry, PA-C  triamcinolone ointment (KENALOG) 0.1 % Apply 1 application topically 2 (two) times daily as needed. For up to 7 days, apply only to thickened, intact, itchy irritated skin 04/15/19   Danelle Berry, PA-C    Allergies Patient has no known allergies.  Family History  Problem Relation Age of Onset  . Hypertension Father   . Diabetes Maternal Grandfather   . Cancer Maternal Grandfather   . Heart attack Paternal Grandmother   . Breast cancer Paternal Grandmother     Social History Social History   Tobacco Use  . Smoking status: Never Smoker  . Smokeless tobacco: Never Used  Substance Use Topics  . Alcohol use: Yes    Comment: occ  .  Drug use: No    Review of Systems  Constitutional: Positive for body aches negative for fever or chills. Eyes: Negative for eye pain, eye redness, discharge or visual changes. ENT: Positive for nasal congestion, loss of smell and sore throat.  Negative for runny nose, ear pain or loss of taste. Cardiovascular: Negative for chest pain or chest tightness. Respiratory: Positive for cough.  Negative for shortness of breath. Gastrointestinal: Negative for nausea, vomiting and diarrhea. Neurological: Positive for headache.  Negative for  focal weakness or numbness. ____________________________________________  PHYSICAL EXAM:  VITAL SIGNS: ED Triage Vitals [07/05/19 1131]  Enc Vitals Group     BP (!) 144/79     Pulse Rate 88     Resp 18     Temp 98.9 F (37.2 C)     Temp Source Oral     SpO2 99 %     Weight 190 lb (86.2 kg)     Height 5\' 8"  (1.727 m)     Head Circumference      Peak Flow      Pain Score 7     Pain Loc      Pain Edu?      Excl. in [place]?     Constitutional: Alert and oriented. Ill appearing but in no distress. Head: Normocephalic and atraumatic. Right maxillary and frontal sinus tenderness noted. Eyes: Conjunctivae are normal. PERRL. Normal extraocular movements Ears: Canals clear. TMs intact bilaterally. Nose: Mucosa boggy, turbinates swollen. Mouth/Throat: Mucous membranes are moist. Posterior pharynx erythema without exudate noted. Hematological/Lymphatic/Immunological: No cervical lymphadenopathy. Cardiovascular: Normal rate, regular rhythm.  Respiratory: Normal respiratory effort. No wheezes/rales/rhonchi. Neurologic:  Normal gait without ataxia. Normal speech and language. No gross focal neurologic deficits are appreciated. __________________________________   LABS  Labs Reviewed  SARS CORONAVIRUS 2 (TAT 6-24 HRS)    ____________________________________________   INITIAL IMPRESSION / ASSESSMENT AND PLAN / ED COURSE  Acute Headache, Facial Pain and Pressure, Nasal Congestion, Loss of Smell, Sore Throat and Cough:  DDX include allergic rhinitis, viral sinusitis, bacterial sinusitis, viral URI with cough, Covid. Covid swab pending Decadron 10 mg IM x 1 RX for Augmentin 875-125 mg PO BID x 10 days to start if symptoms worsen RX for Tessalon Pearls 100 mg TID prn for cough  Margaret Olson was evaluated in Emergency Department on 07/05/2019 for the symptoms described in the history of present illness. She was evaluated in the context of the global COVID-19 pandemic, which  necessitated consideration that the patient might be at risk for infection with the SARS-CoV-2 virus that causes COVID-19. Institutional protocols and algorithms that pertain to the evaluation of patients at risk for COVID-19 are in a state of rapid change based on information released by regulatory bodies including the CDC and federal and state organizations. These policies and algorithms were followed during the patient's care in the ED.  ____________________________________________  FINAL CLINICAL IMPRESSION(S) / ED DIAGNOSES  Final diagnoses:  Viral sinusitis      Jearld Fenton, NP 07/05/19 1201    Harvest Dark, MD 07/05/19 1501    Jearld Fenton, NP 07/05/19 1725    Harvest Dark, MD 07/06/19 2145

## 2019-07-05 NOTE — Discharge Instructions (Addendum)
You were seen today for upper respiratory symptoms.  You received a steroid injection.  Your Covid swab is pending but you should not return to work until you have been called with these results.  We encourage frequent handwashing, masking and social distancing until your Covid results are back.  I have given you prescription for antibiotics to start taking if symptoms worsen, you develop fever, chills, colored discharge from your nose or throat.  I also given you prescription for Tessalon Perles for cough to take every 8 hours as needed.

## 2019-07-13 ENCOUNTER — Telehealth: Payer: Self-pay | Admitting: *Deleted

## 2019-07-13 ENCOUNTER — Telehealth: Payer: Self-pay | Admitting: Family Medicine

## 2019-07-13 NOTE — Telephone Encounter (Signed)
appt made for wed

## 2019-07-13 NOTE — Telephone Encounter (Signed)
Patient stated she went to the er where she was diagnosed with a sinus infection,the antibiotic hasnt worked for her and she would like to know if something else can be prescribed for her.please advise       Documentation    Call to patient- she was given amoxicillin at ED for sinus infection- really not have the improvement she expected.Patient is coughing, producing mucus that is making her nauseous, overall not feeling well. She has 4 doses left to take.Patient advised she will need visit to assess her status- to add or change treatment. Patient states she tested negative for COVID at ED. Attempted to set up video visit- but there are no open appointments for today. It is lunchtime and  So message has been forwarded to see if patient can be added or scheduled sooner than 2 days out.

## 2019-07-13 NOTE — Telephone Encounter (Signed)
Please see phone note  

## 2019-07-13 NOTE — Telephone Encounter (Signed)
Patient stated she went to the er where she was diagnosed with a sinus infection,the antibiotic hasnt worked for her and she would like to know if something else can be prescribed for her.please advise

## 2019-07-14 NOTE — Progress Notes (Signed)
Name: Margaret Olson   MRN: [id number]    DOB: Apr 16, 1970   Date:07/15/2019       Progress Note  Subjective  Chief Complaint  Chief Complaint  Patient presents with  . Cough    started two weeks ago, has taken antibiotics  . Fatigue    No taste, no smell  . sinus pressure    I connected with  Ether Griffins on 07/15/19 at  7:40 AM EDT by telephone and verified that I am speaking with the correct person using two identifiers.  I discussed the limitations, risks, security and privacy concerns of performing an evaluation and management service by telephone and the availability of in person appointments. Staff also discussed with the patient that there may be a patient responsible charge related to this service. Patient Location: Home Provider Location: [facility] Additional Individuals present: none  HPI Patient is a 49 year old female patient of Delsa Grana She was seen in the emergency room 07/05/2019 for sinusitis concerns. Her presenting symptoms were as follows: Margaret Olson is a 49 y.o. female presents to the ER today with complaint of headache, right-sided facial pressure, nasal congestion, sore throat and cough.  She reports this started 6 days ago.  The headache is located in her forehead and behind her eyes.  She describes the pain as pressure.  She denies dizziness or visual changes.  She is blowing clear mucus out of her nose.  She is having some difficulty swallowing.  She has lost her taste of smell.  The cough is mostly nonproductive.  She denies fever or chills but has had body aches.  She denies eye pain, eye redness, discharge, ear pain, loss of taste or shortness of breath.  She does not smoke.  She has not had sick contacts or exposure to Covid that she is aware of.  She has tried Alka-Seltzer OTC with minimal relief of symptoms.  She was tested for Covid which was negative, was given Decadron, and Augmentin prescription to start if symptoms do not improve after  discharge, and also Tessalon Perles recommended for cough. She notes symptoms have not significantly improved and follows up today virtually.   She states she has gotten a little better, although she is not to the point where she thinks she should be after this many days.  Does not feel like it is in her chest, still feels mostly just in the sinus region.  Still coughing some, with frequent drainage from her nose noted.  She has not been taking anything outside of the antibiotic for management.  Not have Covid vaccine  + cough, no production No marked SOB No fever, feeling feverish, some sweats No sore throat.  + mild congestion, + PND - clear mucus + loss of smell, loss of taste Min N/ No V No muscle aches No marked loose stools/diarrhea No CP, passing out episodes No energy, feels fatigued often. Denies allergy history at all, not with change of seasons. Not taking anything except abx.   Comorbid conditions reviewed No asthma/COPD hx,  + DM,  No heart disease, CKD,   Tob - Never smoker  Patient Active Problem List   Diagnosis Date Noted  . Type 2 diabetes mellitus without complication, without long-term current use of insulin ([address]) 03/26/2018  . Amenorrhea 03/26/2018  . Chronic bilateral low back pain without sciatica 03/26/2018  . H. pylori infection 03/26/2018    Past Surgical History:  Procedure Laterality Date  . ENDOMETRIAL BIOPSY    .  TUBAL LIGATION      Family History  Problem Relation Age of Onset  . Hypertension Father   . Diabetes Maternal Grandfather   . Cancer Maternal Grandfather   . Heart attack Paternal Grandmother   . Breast cancer Paternal Grandmother     Social History   Tobacco Use  . Smoking status: Never Smoker  . Smokeless tobacco: Never Used  Substance Use Topics  . Alcohol use: Yes    Comment: occ     Current Outpatient Medications:  .  acetaminophen (TYLENOL) 650 MG CR tablet, Take 650 mg by mouth every 8 (eight) hours as  needed for pain., Disp: , Rfl:  .  amoxicillin-clavulanate (AUGMENTIN) 875-125 MG tablet, Take 1 tablet by mouth 2 (two) times daily for 10 days., Disp: 20 tablet, Rfl: 0 .  Clotrimazole 1 % OINT, Apply 1 application topically 2 (two) times daily as needed., Disp: 50 g, Rfl: 1 .  benzonatate (TESSALON PERLES) 100 MG capsule, Take 1 capsule (100 mg total) by mouth 3 (three) times daily as needed for cough. (Patient not taking: Reported on 07/15/2019), Disp: 15 capsule, Rfl: 0  No Known Allergies  With staff assistance, above reviewed with the patient  today.  ROS: As per HPI, otherwise no specific complaints on a limited and focused system review   Objective  Virtual encounter, vitals not obtained.  Body mass index is 28.89 kg/m.  Physical Exam   Appears in NAD via conversation Breathing: No obvious respiratory distress. Speaking in complete sentences, occasional cough noted on the phone conversation Neurological: Pt is alert and oriented, Speech is normal Psychiatric: Patient has a normal mood and affect, Judgment and thought content normal.   No results found for this or any previous visit (from the past 72 hour(s)).  PHQ2/9: Depression screen [facility] 2/9 07/15/2019 04/07/2019 03/26/2019 07/28/2018 03/25/2018  Decreased Interest 0 0 0 0 0  Down, Depressed, Hopeless 0 0 0 0 0  PHQ - 2 Score 0 0 0 0 0  Altered sleeping 0 0 0 0 0  Tired, decreased energy 2 3 3  0 0  Change in appetite 0 0 1 0 0  Feeling bad or failure about yourself  0 0 0 0 0  Trouble concentrating 0 0 0 0 0  Moving slowly or fidgety/restless 0 0 0 - 0  Suicidal thoughts 0 0 0 0 0  PHQ-9 Score 2 3 4  0 0  Difficult doing work/chores Not difficult at all - Somewhat difficult Not difficult at all Not difficult at all   PHQ-2/9 Result reviewed  Fall Risk: Fall Risk  07/15/2019 04/07/2019 03/26/2019 07/28/2018 03/25/2018  Falls in the past year? 0 0 0 0 0  Number falls in past yr: 0 0 0 0 0  Injury with Fall? 0 0 0 0 0      Assessment & Plan  1. Cough 2. Acute sinusitis, recurrence not specified, unspecified location She did note she is feeling a little better, although concerned with the persistence of symptoms noted.  Still feels mostly in the sinuses, and denies any marked chest congestion or production to her cough.  No fevers.  Mucus is clear.  Do feel likely still struggling with the inflammatory response to the infection.  Has not been taking anything outside of the antibiotic for management presently. Am somewhat concerned with the loss of taste and smell, with Covid seemingly less likely although still in the differential.  She did have a negative test in the emergency  room. We will add a Flonase product-use twice daily for the first 3 days, then daily Also recommended a Robitussin or Mucinex DM product as needed for the cough Also a Tylenol or ibuprofen product as needed to help with any headaches, achiness. Rest and fluids to continue Recommended repeating the Covid test, with obtaining one through a [facility] or local pharmacy probably the best route presently.  Did note it is not very likely, but do think it would be helpful to exclude this presently.  (As she noted she has not been vaccinated) Also finished the Augmentin, which she has 2 days left to complete.  Do not feel adding another antibiotic is appropriate presently, - fluticasone (FLONASE) 50 MCG/ACT nasal spray; Place 2 sprays into both nostrils daily. Can use twice daily for first three days.  Dispense: 16 g; Refill: 1  Emphasized if symptoms are not improving or more problematic, especially if more chest symptoms, more production to her cough, fevers occurring, she needs to follow-up and she was understanding of that.  I discussed the assessment and treatment plan with the patient. The patient was provided an opportunity to ask questions and all were answered. The patient agreed with the plan and demonstrated an understanding of the  instructions.  Red flags and when to present for emergency care or RTC including fever, chest pain, shortness of breath, new/worsening/un-resolving symptoms reviewed with patient at time of visit.   The patient was advised to call back or seek an in-person evaluation if the symptoms worsen or if the condition fails to improve as anticipated.  I provided 15 minutes of non-face-to-face time during this encounter that included discussing at length patient's sx/history, pertinent pmhx, medications, treatment and follow up plan. This time also included the necessary documentation, orders, and chart review.  Jamelle Haring, MD

## 2019-07-15 ENCOUNTER — Encounter: Payer: Self-pay | Admitting: Internal Medicine

## 2019-07-15 ENCOUNTER — Other Ambulatory Visit: Payer: Self-pay

## 2019-07-15 ENCOUNTER — Ambulatory Visit (INDEPENDENT_AMBULATORY_CARE_PROVIDER_SITE_OTHER): Payer: BLUE CROSS/BLUE SHIELD | Admitting: Internal Medicine

## 2019-07-15 VITALS — Ht 68.0 in | Wt 190.0 lb

## 2019-07-15 DIAGNOSIS — R05 Cough: Secondary | ICD-10-CM

## 2019-07-15 DIAGNOSIS — J019 Acute sinusitis, unspecified: Secondary | ICD-10-CM

## 2019-07-15 DIAGNOSIS — R059 Cough, unspecified: Secondary | ICD-10-CM

## 2019-07-15 MED ORDER — FLUTICASONE PROPIONATE 50 MCG/ACT NA SUSP: 2.0000 | Freq: Every day | NASAL | 1 refills | Status: DC

## 2019-09-22 NOTE — Progress Notes (Signed)
Patient ID: Margaret Olson, female    DOB: 1970/09/01, 49 y.o.   MRN: [id number]  PCP: Danelle Berry, PA-C  Chief Complaint  Patient presents with  . Discuss vaccines    Hepatitis A and Typhoid, traveling to [place]    Subjective:   Margaret Olson is a 49 y.o. female, presents to clinic with CC of the following:  Chief Complaint  Patient presents with  . Discuss vaccines    Hepatitis A and Typhoid, traveling to [place]    HPI:  Patient is a 49 year old female patient of Danelle Berry Her last visit at [facility] was by telephone on 07/15/2019 with me Follows up today, would like to discuss a hep A and typhoid vaccine.  She is traveling to [place] on September 13, staying until the 17th , and staying in an air B&B.  She inquired about the recommendations for hepatitis A and typhoid vaccines. She noted having a history of H. pylori, and did not want to have something like that to happen again as she put it. She has not had the Covid vaccine, and does not plan on getting it before she travels.  She noted the requirement is to provide proof of a recent negative Covid test presently. She is not aware if she had the hepatitis B vaccine in her past.  She notes she has not had any vaccines in the recent past.  She also asked about getting the Clotrimazole ointment that was prescribed previously for a rash on her hand, between her fingers frequently that often itches and gets inflamed to be changed to the cream, as the cream was more helpful in the past, and would like to have that to use as needed.  It is not problematic today  She also notes she has a skin lesion on her left lower extremity, noticed years ago after shaving, and in the recent past, it has become much more raised and bigger in size.  Not symptomatic.  She was concerned about this and the next best steps.  Patient Active Problem List   Diagnosis Date Noted  . Type 2 diabetes mellitus without complication,  without long-term current use of insulin (HCC) 03/26/2018  . Amenorrhea 03/26/2018  . Chronic bilateral low back pain without sciatica 03/26/2018  . H. pylori infection 03/26/2018      Current Outpatient Medications:  .  Clotrimazole 1 % OINT, Apply 1 application topically 2 (two) times daily as needed., Disp: 50 g, Rfl: 1   No Known Allergies   Past Surgical History:  Procedure Laterality Date  . ENDOMETRIAL BIOPSY    . TUBAL LIGATION       Family History  Problem Relation Age of Onset  . Hypertension Father   . Diabetes Maternal Grandfather   . Cancer Maternal Grandfather   . Heart attack Paternal Grandmother   . Breast cancer Paternal Grandmother      Social History   Tobacco Use  . Smoking status: Never Smoker  . Smokeless tobacco: Never Used  Substance Use Topics  . Alcohol use: Yes    Comment: occ    With staff assistance, above reviewed with the patient today.  ROS: As per HPI, otherwise no specific complaints on a limited and focused system review   No results found for this or any previous visit (from the past 72 hour(s)).   PHQ2/9: Depression screen [facility] 2/9 09/23/2019 07/15/2019 04/07/2019 03/26/2019 07/28/2018  Decreased Interest 0 0 0  0 0  Down, Depressed, Hopeless 0 0 0 0 0  PHQ - 2 Score 0 0 0 0 0  Altered sleeping - 0 0 0 0  Tired, decreased energy - 2 3 3  0  Change in appetite - 0 0 1 0  Feeling bad or failure about yourself  - 0 0 0 0  Trouble concentrating - 0 0 0 0  Moving slowly or fidgety/restless - 0 0 0 -  Suicidal thoughts - 0 0 0 0  PHQ-9 Score - 2 3 4  0  Difficult doing work/chores - Not difficult at all - Somewhat difficult Not difficult at all   PHQ-2/9 Result is neg  Fall Risk: Fall Risk  09/23/2019 07/15/2019 04/07/2019 03/26/2019 07/28/2018  Falls in the past year? 0 0 0 0 0  Number falls in past yr: 0 0 0 0 0  Injury with Fall? 0 0 0 0 0      Objective:   Vitals:   09/23/19 0928  BP: 124/78  Pulse: 78  Resp: 16  Temp:  97.8 F (36.6 C)  TempSrc: Oral  SpO2: 97%  Weight: 197 lb (89.4 kg)  Height: 5\' 8"  (1.727 m)    Body mass index is 29.95 kg/m.  Physical Exam   NAD, masked, pleasant HEENT - [place]/AT, sclera anicteric,  Skin- no rash concern noted on the hands presently, a circular raised lesion was present on the left lower extremity anterior lateral which was firm, nontender, no marked surrounding erythema. Neuro/psychiatric - affect was not flat, appropriate with conversation  Alert with normal speech  Results for orders placed or performed during the hospital encounter of 07/05/19  SARS CORONAVIRUS 2 (TAT 6-24 HRS) Nasopharyngeal Nasopharyngeal Swab   Specimen: Nasopharyngeal Swab  Result Value Ref Range   SARS Coronavirus 2 NEGATIVE NEGATIVE       Assessment & Plan:   1. Dermatitis Intermittent on her hands, with a Chlortrimazole cream effective in the past when she has symptoms arise.  She was provided the ointment on last prescription and prefers to have the cream. Felt reasonable to prescribe the cream for her today. She also may want to get an opinion when she sees the dermatologist with this as well. - clotrimazole (LOTRIMIN) 1 % cream; Apply 1 application topically 2 (two) times daily. Apply as needed  Dispense: 28 g; Refill: 1  2. Skin lesion Unclear of the exact source of that skin lesion on her left lower extremity, although does appear to be benign appearing. Given the recent evolution, offered her to see a dermatologist, and she wanted to proceed with that and a referral given. - Ambulatory referral to Dermatology  3. Travel advice encounter Reviewed some of the information from the [facility] website with travel to and the  Recommendations.  Also recommended she review the website information. I noted most concerning presently with the pandemic ongoing is the Covid requirements, and she has not had the vaccine.  She noted the only requirement being proof of a negative test  recently before she travels.  I did note how this could rapidly change, and the importance of staying up with the recommendations before she travels.  With the recent FDA approval of the vaccine, do anticipate this becoming more of a requirement over time, how quickly that may occur is the question. With respect to the hepatitis A vaccine, it is recommended before travel, as is the hepatitis B.  Discussed these entities with her, and she did want  to proceed with getting the hepatitis A vaccine.  I have asked Melissa to try to investigate her hepatitis B status through  [facility] as we do not have further information in her records, and did discuss if she is in need of having hepatitis B vaccine as well, would recommend getting that today as well.  Did note the second doses of these vaccines, and recommendations to get those as well noted today. Discussed the typhoid vaccine, noting it is recommended for most travelers, especially if staying with friends or relatives were visiting smaller cities or oral areas.  Her trip is relatively short.  Did discuss the recommendations from the CDC with respect to typhoid fever and prevention, and included these on her AVS as well.  There are both an oral vaccine that is a pill every other day for 4 doses taken at least a week before travel, and there is an injection that is recommended as an alternative to be obtained 2 weeks before travel.  Neither is complete protection, and are about 50 to 80% effective in preventing. She opted to not have the typhoid vaccine, and emphasized the importance of those CDC recommendations to help prevent.  She did receive both the hepatitis A and hepatitis B vaccine today, with instructions on the timing of the follow-up for the second dose of these given today as well.    Jamelle Haring, MD 09/23/19 9:42 AM

## 2019-09-23 ENCOUNTER — Encounter: Payer: Self-pay | Admitting: Internal Medicine

## 2019-09-23 ENCOUNTER — Other Ambulatory Visit: Payer: Self-pay

## 2019-09-23 ENCOUNTER — Ambulatory Visit (INDEPENDENT_AMBULATORY_CARE_PROVIDER_SITE_OTHER): Payer: BLUE CROSS/BLUE SHIELD | Admitting: Internal Medicine

## 2019-09-23 VITALS — BP 124/78 | HR 78 | Temp 97.8°F | Resp 16 | Ht 68.0 in | Wt 197.0 lb

## 2019-09-23 DIAGNOSIS — Z23 Encounter for immunization: Secondary | ICD-10-CM

## 2019-09-23 DIAGNOSIS — L309 Dermatitis, unspecified: Secondary | ICD-10-CM

## 2019-09-23 DIAGNOSIS — L989 Disorder of the skin and subcutaneous tissue, unspecified: Secondary | ICD-10-CM | POA: Diagnosis not present

## 2019-09-23 DIAGNOSIS — Z7184 Encounter for health counseling related to travel: Secondary | ICD-10-CM

## 2019-09-23 LAB — HM PAP SMEAR

## 2019-09-23 MED ORDER — CLOTRIMAZOLE 1 % EX CREA: 1.0000 "application " | TOPICAL_CREAM | Freq: Two times a day (BID) | CUTANEOUS | 1 refills | Status: AC

## 2019-09-23 NOTE — Patient Instructions (Addendum)
A referral to dermatology was placed today The Chlortrimazole ointment was changed to the cream as requested  I would recommend the CDC website for recommendations with travel to [place] From this website, information about typhoid fever is noted below.  What can travelers do to prevent typhoid fever? Getting vaccinated, choosing food and drinks carefully, and washing your hands are the best ways to avoid getting typhoid.  Check if the typhoid fever vaccination is recommended for your destination. Two typhoid vaccines are available in the [place].  Pill vaccine. People 72 years old and older can take the pill vaccine. Finish taking all four pills (1 pill every other day) at least 1 week before travel. Shot vaccine. People 70 years old and older can get the shot vaccine. Get one shot (or a booster shot) at least 2 weeks before travel. Neither the typhoid pills or shot offer complete protection against infection (50%-80%) and both lose effectiveness over time. Also, there isn't a vaccine that protects against paratyphoid fever. For these reasons, it's very important that you also take the following steps to prevent typhoid.  Choose food and drinks carefully  Only eat foods that are cooked and served hot Avoid food that has been sitting on a buffet Eat raw fruits and vegetables only if you have washed them in clean water or peeled them Only drink beverages from factory-sealed containers Avoid ice because it may have been made from unclean water Only drink pasteurized milk Wash your hands  Wash hands often with soap and water for 20 seconds, especially after using the bathroom and before eating If soap and water aren't available, use an alcohol-based hand sanitizer that contains at least 60% alcohol Keep your hands away from your face and mouth

## 2019-10-12 ENCOUNTER — Ambulatory Visit: Payer: BLUE CROSS/BLUE SHIELD

## 2019-10-21 ENCOUNTER — Ambulatory Visit: Payer: BLUE CROSS/BLUE SHIELD

## 2019-11-02 NOTE — Progress Notes (Deleted)
    Patient ID: Margaret Olson, female    DOB: [birth year], 49 y.o.   MRN: [id number]  PCP: Danelle Berry, PA-C  No chief complaint on file.   Subjective:   Margaret Olson is a 49 y.o. female, presents to clinic with CC of the following:  HPI    Patient Active Problem List   Diagnosis Date Noted  . Type 2 diabetes mellitus without complication, without long-term current use of insulin (HCC) 03/26/2018  . Amenorrhea 03/26/2018  . Chronic bilateral low back pain without sciatica 03/26/2018  . H. pylori infection 03/26/2018      Current Outpatient Medications:  .  clotrimazole (LOTRIMIN) 1 % cream, Apply 1 application topically 2 (two) times daily. Apply as needed, Disp: 28 g, Rfl: 1   No Known Allergies   Social History   Tobacco Use  . Smoking status: Never Smoker  . Smokeless tobacco: Never Used  Vaping Use  . Vaping Use: Never used  Substance Use Topics  . Alcohol use: Yes    Comment: occ  . Drug use: No      Chart Review Today: ***  Review of Systems     Objective:   There were no vitals filed for this visit.  There is no height or weight on file to calculate BMI.  Physical Exam   Results for orders placed or performed in visit on 09/23/19  HM PAP SMEAR  Result Value Ref Range   HM Pap smear 05/26/2019        Assessment & Plan:   Marcos Eke, CMA 11/02/19 2:12 PM

## 2019-11-03 ENCOUNTER — Ambulatory Visit: Payer: BLUE CROSS/BLUE SHIELD | Admitting: Family Medicine

## 2019-11-03 ENCOUNTER — Other Ambulatory Visit: Payer: Self-pay

## 2019-11-24 ENCOUNTER — Encounter: Payer: BLUE CROSS/BLUE SHIELD | Admitting: Family Medicine

## 2019-11-24 ENCOUNTER — Emergency Department: Payer: BLUE CROSS/BLUE SHIELD

## 2019-11-24 ENCOUNTER — Other Ambulatory Visit: Payer: Self-pay

## 2019-11-24 ENCOUNTER — Emergency Department
Admission: EM | Admit: 2019-11-24 | Discharge: 2019-11-25 | Disposition: A | Discharge status: Home / Self Care | Payer: BLUE CROSS/BLUE SHIELD | Attending: Student in an Organized Health Care Education/Training Program | Admitting: Student in an Organized Health Care Education/Training Program

## 2019-11-24 DIAGNOSIS — U071 COVID-19: Secondary | ICD-10-CM | POA: Diagnosis not present

## 2019-11-24 DIAGNOSIS — R197 Diarrhea, unspecified: Secondary | ICD-10-CM | POA: Diagnosis present

## 2019-11-24 DIAGNOSIS — E119 Type 2 diabetes mellitus without complications: Secondary | ICD-10-CM | POA: Diagnosis not present

## 2019-11-24 LAB — RESPIRATORY PANEL BY RT PCR (FLU A&B, COVID)
Influenza A by PCR: NEGATIVE
Influenza B by PCR: NEGATIVE
SARS Coronavirus 2 by RT PCR: POSITIVE — AB

## 2019-11-24 MED ORDER — ACETAMINOPHEN 500 MG PO TABS
1000.0000 mg | ORAL_TABLET | Freq: Once | ORAL | Status: AC
  Administered 2019-11-24: 1000 mg via ORAL
  Filled 2019-11-24: qty 2

## 2019-11-24 MED ORDER — ONDANSETRON 4 MG PO TBDP: 4.0000 mg | ORAL_TABLET | Freq: Three times a day (TID) | ORAL | 0 refills | Status: DC | PRN

## 2019-11-24 MED ORDER — ONDANSETRON 4 MG PO TBDP
4.0000 mg | ORAL_TABLET | Freq: Once | ORAL | Status: AC
  Administered 2019-11-24: 4 mg via ORAL
  Filled 2019-11-24: qty 1

## 2019-11-24 NOTE — ED Notes (Signed)
See triage note. Pt ambulatory to room. ?

## 2019-11-24 NOTE — ED Triage Notes (Signed)
Pt in with co body aches, general malaise, diarrhea, nausea without vomiting since last Wednesday. Pt denies any fever and lost taste and smell yesterday.

## 2019-11-24 NOTE — ED Notes (Signed)
Date and time results received: 11/24/19 10:42 PM   (use smartphrase ".now" to insert current time)  Test: COVID  Critical Value: +  Name of Provider Notified: PA Caitlin  Orders Received? Or Actions Taken?: No new orders at this time

## 2019-11-25 ENCOUNTER — Telehealth: Payer: Self-pay | Admitting: Unknown Physician Specialty

## 2019-11-25 ENCOUNTER — Other Ambulatory Visit: Payer: Self-pay | Admitting: Unknown Physician Specialty

## 2019-11-25 NOTE — ED Provider Notes (Signed)
[facility] Emergency Department Provider Note  ____________________________________________   First MD Initiated Contact with Patient 11/24/19 2214     (approximate)  I have reviewed the triage vital signs and the nursing notes.   HISTORY  Chief Complaint Generalized Body Aches and Headache  HPI NORLEEN XIE is a 49 y.o. female who presents to the emergency department for evaluation of body aches, malaise, diarrhea since last Wednesday.  She denies having any fever, but states that she regularly has chills.  She did lose sense of taste and smell yesterday.  She denies any shortness of breath, cough, nasal congestion.  She has been trying to treat her symptoms with TheraFlu, Alka-Seltzer type of over-the-counter treatments without any benefit.  She has not had much of an appetite and has found it difficult to she denies any known Covid exposures, but has not been vaccinated against COVID-19.          Past Medical History:  Diagnosis Date  . Diabetes mellitus without complication [facility])     Patient Active Problem List   Diagnosis Date Noted  . Type 2 diabetes mellitus without complication, without long-term current use of insulin (HCC) 03/26/2018  . Amenorrhea 03/26/2018  . Chronic bilateral low back pain without sciatica 03/26/2018  . H. pylori infection 03/26/2018    Past Surgical History:  Procedure Laterality Date  . ENDOMETRIAL BIOPSY    . TUBAL LIGATION      Prior to Admission medications   Medication Sig Start Date End Date Taking? Authorizing Provider  clotrimazole (LOTRIMIN) 1 % cream Apply 1 application topically 2 (two) times daily. Apply as needed 09/23/19   Jamelle Haring, MD  ondansetron (ZOFRAN ODT) 4 MG disintegrating tablet Take 1 tablet (4 mg total) by mouth every 8 (eight) hours as needed for nausea or vomiting. 11/24/19   Lucy Chris, PA    Allergies Patient has no known allergies.  Family History    Problem Relation Age of Onset  . Hypertension Father   . Diabetes Maternal Grandfather   . Cancer Maternal Grandfather   . Heart attack Paternal Grandmother   . Breast cancer Paternal Grandmother     Social History Social History   Tobacco Use  . Smoking status: Never Smoker  . Smokeless tobacco: Never Used  Vaping Use  . Vaping Use: Never used  Substance Use Topics  . Alcohol use: Yes    Comment: occ  . Drug use: No    Review of Systems Constitutional: No fever, +chills Eyes: No visual changes. ENT: No sore throat. Cardiovascular: Denies chest pain. Respiratory: Denies shortness of breath. Gastrointestinal: No abdominal pain.  + nausea, no vomiting.  + diarrhea.  No constipation. Genitourinary: Negative for dysuria. Musculoskeletal: Negative for back pain. Skin: Negative for rash. Neurological: +for headaches, negative for focal weakness or numbness.  ____________________________________________   PHYSICAL EXAM:  VITAL SIGNS: ED Triage Vitals [11/24/19 2137]  Enc Vitals Group     BP 116/83     Pulse Rate (!) 102     Resp 18     Temp 98.9 F (37.2 C)     Temp Source Oral     SpO2 97 %     Weight 186 lb (84.4 kg)     Height 5\' 8"  (1.727 m)     Head Circumference      Peak Flow      Pain Score 8     Pain Loc  Pain Edu?      Excl. in GC?    Constitutional: Alert and oriented. Well appearing and in no acute distress. Eyes: Conjunctivae are normal.  Head: Atraumatic. Nose: No congestion/rhinnorhea. Mouth/Throat: Mucous membranes are moist.  Oropharynx non-erythematous. Neck: No stridor.   Cardiovascular: Normal rate, regular rhythm. Grossly normal heart sounds.  Good peripheral circulation. Respiratory: Normal respiratory effort.  No retractions. Lungs CTAB. Gastrointestinal: Soft and nontender. No distention.  No CVA tenderness. Musculoskeletal: No lower extremity tenderness nor edema.  No joint effusions. Neurologic:  Normal speech and language.  No gross focal neurologic deficits are appreciated. No gait instability. Skin:  Skin is warm, dry and intact. No rash noted. Psychiatric: Mood and affect are normal. Speech and behavior are normal.  ____________________________________________   LABS (all labs ordered are listed, but only abnormal results are displayed)  Labs Reviewed  RESPIRATORY PANEL BY RT PCR (FLU A&B, COVID) - Abnormal; Notable for the following components:      Result Value   SARS Coronavirus 2 by RT PCR POSITIVE (*)    All other components within normal limits   ____________________________________________  RADIOLOGY I, Lucy Chris, personally viewed and evaluated these images (plain radiographs) as part of my medical decision making, as well as reviewing the written report by the radiologist.  Official radiology report(s): DG Chest Portable 1 View  Result Date: 11/24/2019 CLINICAL DATA:  COVID body aches diarrhea EXAM: PORTABLE CHEST 1 VIEW COMPARISON:  07/21/2016 FINDINGS: The heart size and mediastinal contours are within normal limits. Both lungs are clear. The visualized skeletal structures are unremarkable. IMPRESSION: No active disease. Electronically Signed   By: Jasmine Pang M.D.   On: 11/24/2019 23:27    ____________________________________________   INITIAL IMPRESSION / ASSESSMENT AND PLAN / ED COURSE  As part of my medical decision making, I reviewed the following data within the electronic MEDICAL RECORD NUMBER Nursing notes reviewed and incorporated        Patient is a 49 year old female who presents to the emergency department with flulike symptoms since last Wednesday.  Patient has not had any specific known Covid exposure but is also not vaccinated.  Overall she appears well, lungs clear to auscultation.  She states that her largest complaint at this time is the nausea and feeling like she cannot eat and because of this she feels weak.  Evaluation in our facility includes a respiratory  panel which is positive for Covid.  The nature of Covid was discussed with the patient.  At this time, her SPO2 and respiration rate of 18 is reassuring and I do not feel at this moment she would have a strong benefit from steroids and this may cause complication from her diabetes.  I will prescribe her some Zofran to take for the next several days for her nausea.  I also advised quarantining away from other individuals.  Strict return precautions were discussed with the patient that should she experience increasing shortness of breath or have chest pain she should return to the emergency department.  The patient is amenable with this plan and will otherwise follow-up outpatient.      ____________________________________________   FINAL CLINICAL IMPRESSION(S) / ED DIAGNOSES  Final diagnoses:  COVID     ED Discharge Orders         Ordered    ondansetron (ZOFRAN ODT) 4 MG disintegrating tablet  Every 8 hours PRN        11/24/19 2334          *  Please note:  KAMILA BRODA was evaluated in Emergency Department on 11/25/2019 for the symptoms described in the history of present illness. She was evaluated in the context of the global COVID-19 pandemic, which necessitated consideration that the patient might be at risk for infection with the SARS-CoV-2 virus that causes COVID-19. Institutional protocols and algorithms that pertain to the evaluation of patients at risk for COVID-19 are in a state of rapid change based on information released by regulatory bodies including the CDC and federal and state organizations. These policies and algorithms were followed during the patient's care in the ED.  Some ED evaluations and interventions may be delayed as a result of limited staffing during and the pandemic.*   Note:  This document was prepared using Dragon voice recognition software and may include unintentional dictation errors.    Lucy Chris, PA 11/25/19 [facility]    Willy Eddy,  MD 11/25/19 [phone]

## 2019-11-25 NOTE — Telephone Encounter (Signed)
Called to Discuss with patient about Covid symptoms and the use of the monoclonal antibody infusion for those with mild to moderate Covid symptoms and at a high risk of hospitalization.     Pt appears to qualify for this infusion due to co-morbid conditions and/or a member of an at-risk group in accordance with the FDA Emergency Use Authorization.    Unable to reach pt   Mailbox is full 

## 2020-01-04 ENCOUNTER — Ambulatory Visit: Payer: BLUE CROSS/BLUE SHIELD | Admitting: Dermatology

## 2020-03-14 ENCOUNTER — Emergency Department: Payer: BLUE CROSS/BLUE SHIELD

## 2020-03-14 ENCOUNTER — Emergency Department
Admission: EM | Admit: 2020-03-14 | Discharge: 2020-03-14 | Disposition: A | Discharge status: Home / Self Care | Payer: BLUE CROSS/BLUE SHIELD | Attending: Emergency Medicine | Admitting: Emergency Medicine

## 2020-03-14 ENCOUNTER — Encounter: Payer: Self-pay | Admitting: Emergency Medicine

## 2020-03-14 ENCOUNTER — Other Ambulatory Visit: Payer: Self-pay

## 2020-03-14 DIAGNOSIS — E119 Type 2 diabetes mellitus without complications: Secondary | ICD-10-CM | POA: Diagnosis not present

## 2020-03-14 DIAGNOSIS — W01198A Fall on same level from slipping, tripping and stumbling with subsequent striking against other object, initial encounter: Secondary | ICD-10-CM | POA: Diagnosis not present

## 2020-03-14 DIAGNOSIS — S4991XA Unspecified injury of right shoulder and upper arm, initial encounter: Secondary | ICD-10-CM | POA: Diagnosis present

## 2020-03-14 DIAGNOSIS — S42301A Unspecified fracture of shaft of humerus, right arm, initial encounter for closed fracture: Secondary | ICD-10-CM | POA: Insufficient documentation

## 2020-03-14 DIAGNOSIS — Y92512 Supermarket, store or market as the place of occurrence of the external cause: Secondary | ICD-10-CM | POA: Insufficient documentation

## 2020-03-14 DIAGNOSIS — R52 Pain, unspecified: Secondary | ICD-10-CM

## 2020-03-14 MED ORDER — HYDROMORPHONE HCL 1 MG/ML IJ SOLN
1.0000 mg | Freq: Once | INTRAMUSCULAR | Status: AC
  Administered 2020-03-14: 1 mg via INTRAMUSCULAR
  Filled 2020-03-14: qty 1

## 2020-03-14 MED ORDER — ONDANSETRON HCL 8 MG PO TABS: 8.0000 mg | ORAL_TABLET | Freq: Three times a day (TID) | ORAL | 0 refills | Status: AC | PRN

## 2020-03-14 MED ORDER — OXYCODONE-ACETAMINOPHEN 7.5-325 MG PO TABS: 1.0000 | ORAL_TABLET | ORAL | 0 refills | Status: DC | PRN

## 2020-03-14 MED ORDER — IBUPROFEN 600 MG PO TABS: 600.0000 mg | ORAL_TABLET | Freq: Three times a day (TID) | ORAL | 0 refills | Status: AC | PRN

## 2020-03-14 NOTE — Consult Note (Signed)
Contacted by Durward Parcel, PA in the ER regarding this 50 year old hand dominant female who fell off a curb at [facility] injuring her right arm.  Mr. Katrinka Blazing states that the patient has intact skin and is neurovascularly intact.  I reviewed the patient's x-ray which shows a displaced comminuted fracture of the midshaft of the right humerus.  I have recommended splinting of the right upper extremity with a posterior splint and sling.  Patient will call the office for follow-up later this week.

## 2020-03-14 NOTE — ED Triage Notes (Signed)
Fell at [place] from a curb.  C/O right shoulder and arm pain.

## 2020-03-14 NOTE — ED Provider Notes (Signed)
[facility] Emergency Department Provider Note   ____________________________________________   Event Date/Time   First MD Initiated Contact with Patient 03/14/20 1149     (approximate)  I have reviewed the triage vital signs and the nursing notes.   HISTORY  Chief Complaint Fall    HPI Margaret Olson is a 50 y.o. female patient complains of right shoulder and forearm pain secondary to trip and fall.  Patient state she had a curve at [facility].  Patient states she hit her arm/shoulder a posted sign.  Denies LOC or head injury.  Denies loss of sensation.  Patient did pain with movement from the wrist to the shoulder.  Patient described the pain is "sharp".  No palliative measure prior to arrival.  Rates pain as  10/10.     Past Medical History:  Diagnosis Date  . Diabetes mellitus without complication [facility])     Patient Active Problem List   Diagnosis Date Noted  . Type 2 diabetes mellitus without complication, without long-term current use of insulin (HCC) 03/26/2018  . Amenorrhea 03/26/2018  . Chronic bilateral low back pain without sciatica 03/26/2018  . H. pylori infection 03/26/2018    Past Surgical History:  Procedure Laterality Date  . ENDOMETRIAL BIOPSY    . TUBAL LIGATION      Prior to Admission medications   Medication Sig Start Date End Date Taking? Authorizing Provider  ibuprofen (ADVIL) 600 MG tablet Take 1 tablet (600 mg total) by mouth every 8 (eight) hours as needed. 03/14/20  Yes Joni Reining, PA-C  oxyCODONE-acetaminophen (PERCOCET) 7.5-325 MG tablet Take 1 tablet by mouth every 4 (four) hours as needed for severe pain. 03/14/20  Yes Joni Reining, PA-C  clotrimazole (LOTRIMIN) 1 % cream Apply 1 application topically 2 (two) times daily. Apply as needed 09/23/19   Jamelle Haring, MD  ondansetron (ZOFRAN ODT) 4 MG disintegrating tablet Take 1 tablet (4 mg total) by mouth every 8 (eight) hours as needed for nausea or  vomiting. 11/24/19   Lucy Chris, PA    Allergies Patient has no known allergies.  Family History  Problem Relation Age of Onset  . Hypertension Father   . Diabetes Maternal Grandfather   . Cancer Maternal Grandfather   . Heart attack Paternal Grandmother   . Breast cancer Paternal Grandmother     Social History Social History   Tobacco Use  . Smoking status: Never Smoker  . Smokeless tobacco: Never Used  Vaping Use  . Vaping Use: Never used  Substance Use Topics  . Alcohol use: Yes    Comment: occ  . Drug use: No    Review of Systems  Constitutional: No fever/chills Eyes: No visual changes. ENT: No sore throat. Cardiovascular: Denies chest pain. Respiratory: Denies shortness of breath. Gastrointestinal: No abdominal pain.  No nausea, no vomiting.  No diarrhea.  No constipation. Genitourinary: Negative for dysuria. Musculoskeletal: Right upper arm pain.  Skin: Negative for rash. Neurological: Negative for headaches, focal weakness or numbness. Endocrine:  Diabetes ____________________________________________   PHYSICAL EXAM:  VITAL SIGNS: ED Triage Vitals  Enc Vitals Group     BP 03/14/20 1144 139/79     Pulse Rate 03/14/20 1144 96     Resp 03/14/20 1144 18     Temp 03/14/20 1144 99.6 F (37.6 C)     Temp Source 03/14/20 1144 Oral     SpO2 03/14/20 1144 97 %     Weight 03/14/20 1131 186 lb  1.1 oz (84.4 kg)     Height 03/14/20 1131 5\' 8"  (1.727 m)     Head Circumference --      Peak Flow --      Pain Score 03/14/20 1130 10     Pain Loc --      Pain Edu? --      Excl. in GC? --    Constitutional: Alert and oriented. Well appearing and in no acute distress. Eyes: Conjunctivae are normal. PERRL. EOMI. Head: Atraumatic. Nose: No congestion/rhinnorhea. Mouth/Throat: Mucous membranes are moist.  Oropharynx non-erythematous. Neck: No stridor.  Cardiovascular: Normal rate, regular rhythm. Grossly normal heart sounds.  Good peripheral  circulation. Respiratory: Normal respiratory effort.  No retractions. Lungs CTAB. Gastrointestinal: Soft and nontender. No distention. No abdominal bruits. No CVA tenderness. Musculoskeletal: Obvious deformity to the right shoulder.  Patient is moderate guarding palpation of the midshaft of the humerus.. Neurologic:  Normal speech and language. No gross focal neurologic deficits are appreciated. No gait instability. Skin:  Skin is warm, dry and intact. No rash noted. Psychiatric: Mood and affect are normal. Speech and behavior are normal.  ____________________________________________   LABS (all labs ordered are listed, but only abnormal results are displayed)  Labs Reviewed - No data to display ____________________________________________  EKG   ____________________________________________  RADIOLOGY I, 03/16/20, personally viewed and evaluated these images (plain radiographs) as part of my medical decision making, as well as reviewing the written report by the radiologist.  ED MD interpretation: Displaced right humeral shaft fracture.  Official radiology report(s): DG Humerus Right  Result Date: 03/14/2020 CLINICAL DATA:  Right arm pain after fall. EXAM: RIGHT HUMERUS - 2+ VIEW COMPARISON:  None. FINDINGS: Severely displaced and comminuted fracture is seen involving the proximal right humeral shaft. IMPRESSION: Severely displaced and comminuted proximal right humeral shaft fracture. Electronically Signed   By: 03/16/2020 M.D.   On: 03/14/2020 12:46    ____________________________________________   PROCEDURES  Procedure(s) performed (including Critical Care):  Procedures   ____________________________________________   INITIAL IMPRESSION / ASSESSMENT AND PLAN / ED COURSE  As part of my medical decision making, I reviewed the following data within the electronic MEDICAL RECORD NUMBER         Patient presents with right arm pain secondary to a fracture  humerus.  Discussed x-ray findings with patient.  Patient placed in a splint and sling.  Discussed patient with the on-call orthopedic doctor.  Patient placed in a splint and sling with follow-up by calling for an appointment tomorrow morning.  Patient advised on the drug effects of pain medications.      ____________________________________________   FINAL CLINICAL IMPRESSION(S) / ED DIAGNOSES  Final diagnoses:  Closed fracture of shaft of right humerus, unspecified fracture morphology, initial encounter     ED Discharge Orders         Ordered    oxyCODONE-acetaminophen (PERCOCET) 7.5-325 MG tablet  Every 4 hours PRN        03/14/20 1310    ibuprofen (ADVIL) 600 MG tablet  Every 8 hours PRN        03/14/20 1310          *Please note:  Margaret Olson was evaluated in Emergency Department on 03/14/2020 for the symptoms described in the history of present illness. She was evaluated in the context of the global COVID-19 pandemic, which necessitated consideration that the patient might be at risk for infection with the SARS-CoV-2 virus that causes COVID-19. Institutional  protocols and algorithms that pertain to the evaluation of patients at risk for COVID-19 are in a state of rapid change based on information released by regulatory bodies including the CDC and federal and state organizations. These policies and algorithms were followed during the patient's care in the ED.  Some ED evaluations and interventions may be delayed as a result of limited staffing during and the pandemic.*   Note:  This document was prepared using Dragon voice recognition software and may include unintentional dictation errors.    Joni Reining, PA-C 03/14/20 1315    Sharyn Creamer, MD 03/14/20 [phone]

## 2020-03-14 NOTE — Discharge Instructions (Signed)
Follow discharge care instruction.  Wear splint and sling until evaluated by orthopedics.  Call the clinic listed on your discharge care instructions at 8:30 in the morning.  Tell them you are a follow-up from the emergency room.

## 2020-05-07 IMAGING — CT CT ABD-PELV W/ CM
2 of 5 series · 16 of 46 positions shown, 18 images · IV contrast (APPLIED)
Comparison: None.

CLINICAL DATA: Acute right lower quadrant abdominal pain.

EXAM:
CT ABDOMEN AND PELVIS WITH CONTRAST
TECHNIQUE: Multidetector CT imaging of the abdomen and pelvis was performed
using the standard protocol following bolus administration of
intravenous contrast.
CONTRAST:  100mL OMNIPAQUE IOHEXOL 300 MG/ML  SOLN

[Series 2: routine abd/pel with · axial · 0.80mm/px · z∈[-513,-53]mm · 13 of 104 slices shown, 15 images]
[im 6/104  soft-tissue]
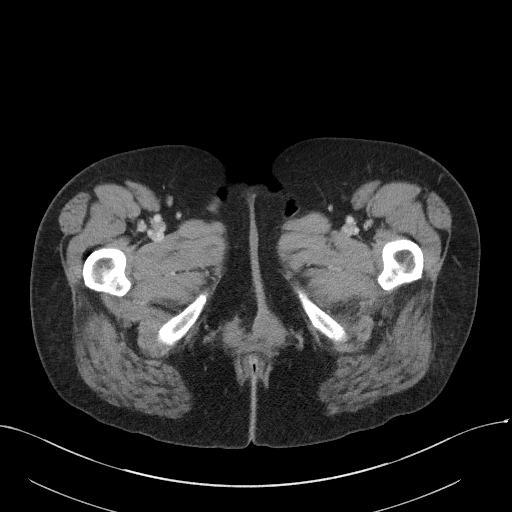
[im 6/104  bone]
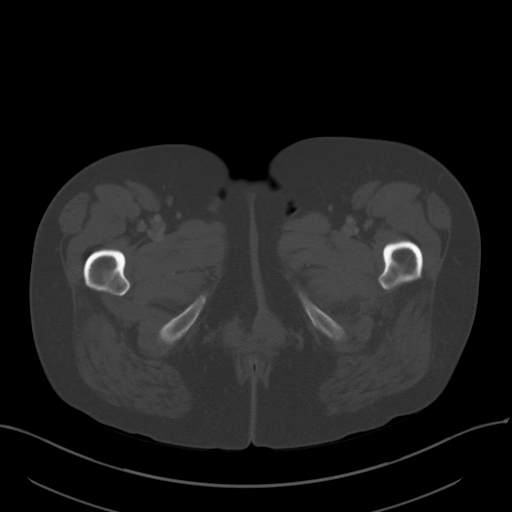
[im 12/104  soft-tissue]
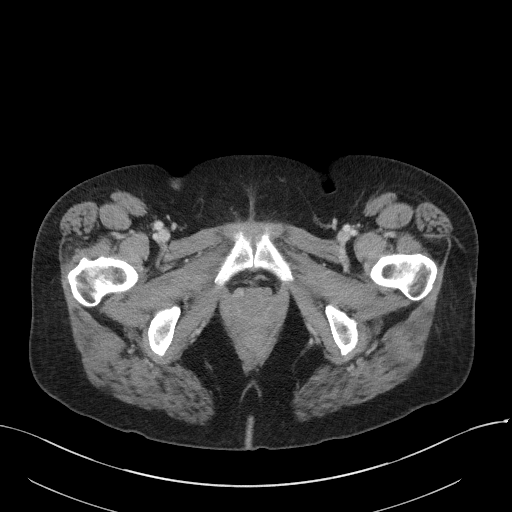
[im 23/104  soft-tissue]
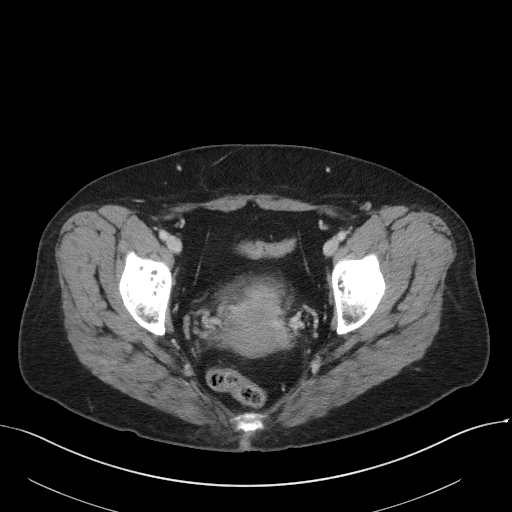
[im 29/104  soft-tissue]
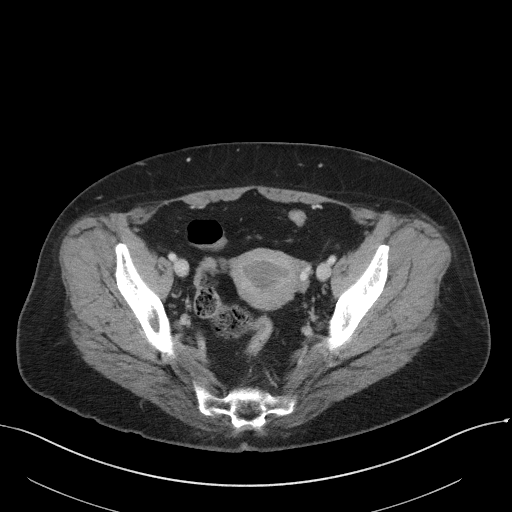
[im 35/104  soft-tissue]
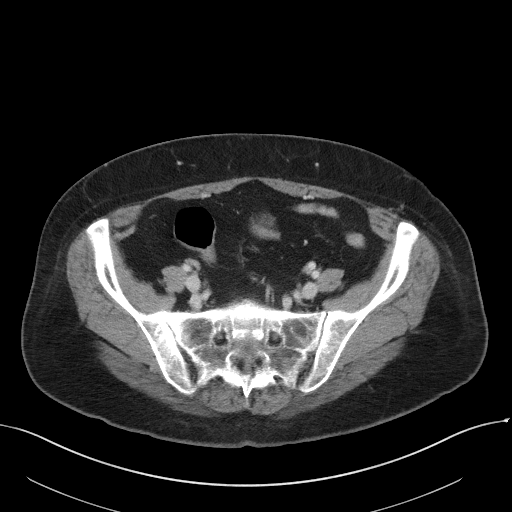
[im 46/104  soft-tissue]
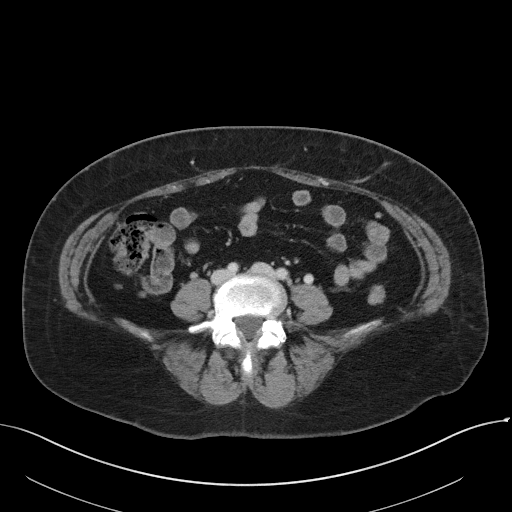
[im 52/104  soft-tissue]
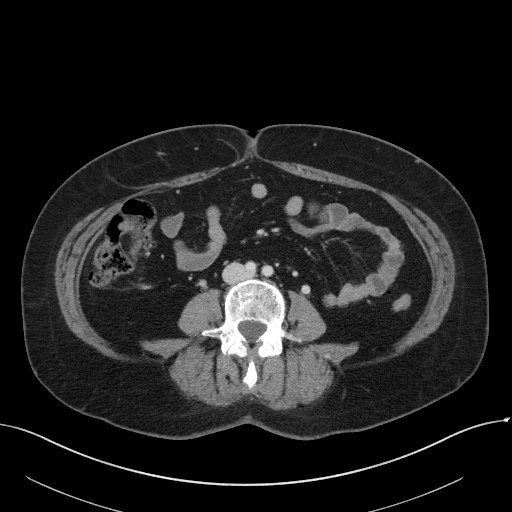
[im 58/104  soft-tissue]
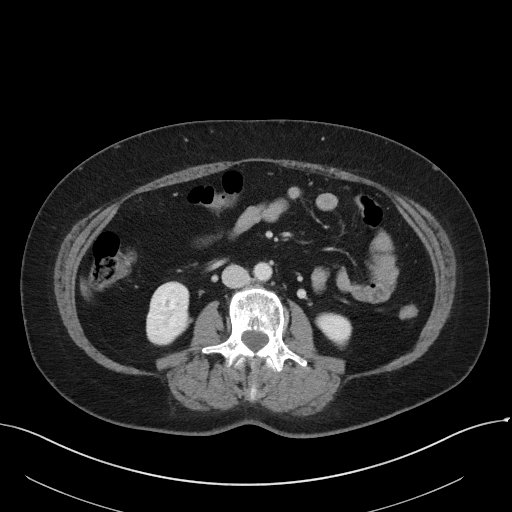
[im 69/104  soft-tissue]
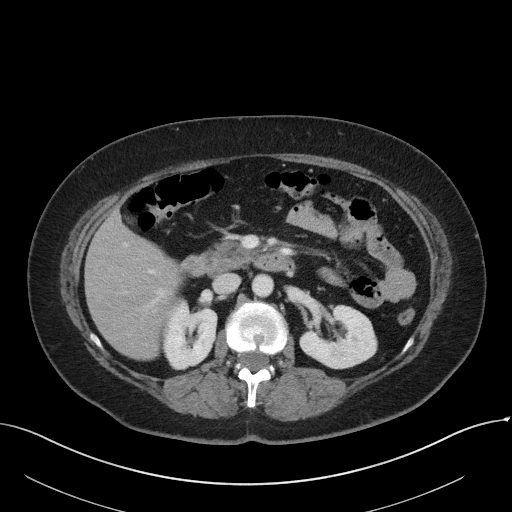
[im 69/104  bone]
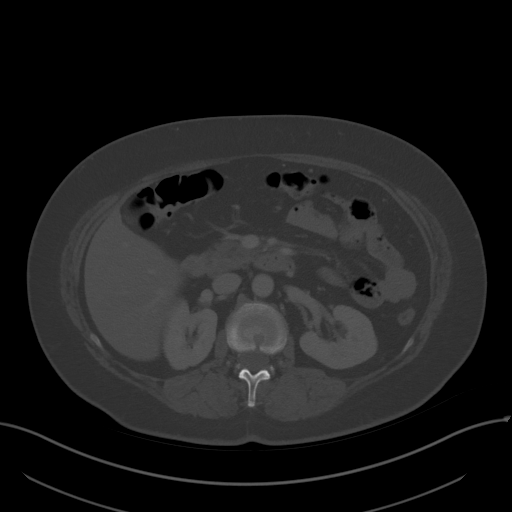
[im 75/104  soft-tissue]
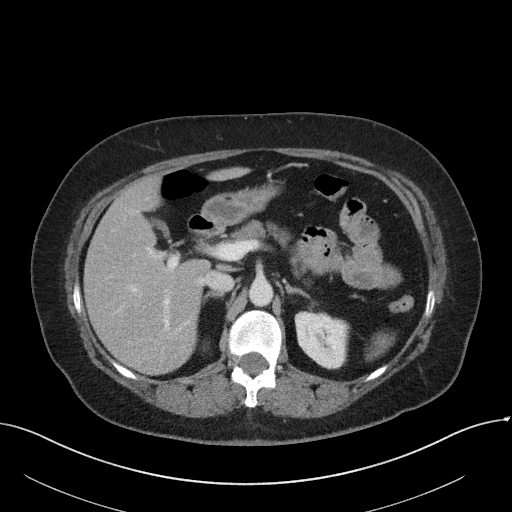
[im 81/104  soft-tissue]
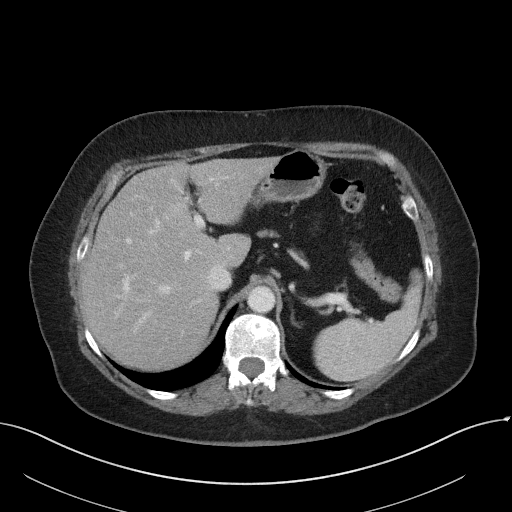
[im 92/104  soft-tissue]
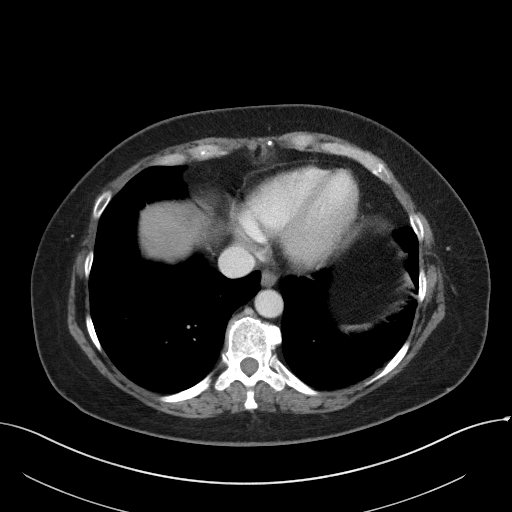
[im 98/104  soft-tissue]
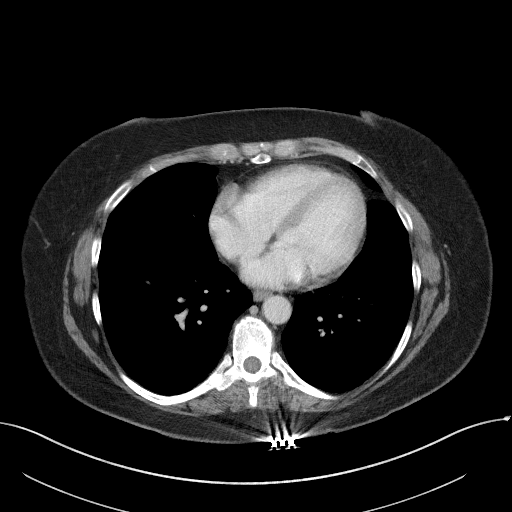

[Series 5: coronal st · coronal · 0.83mm/px · 3 of 92 slices shown]
[im 31/92  soft-tissue]
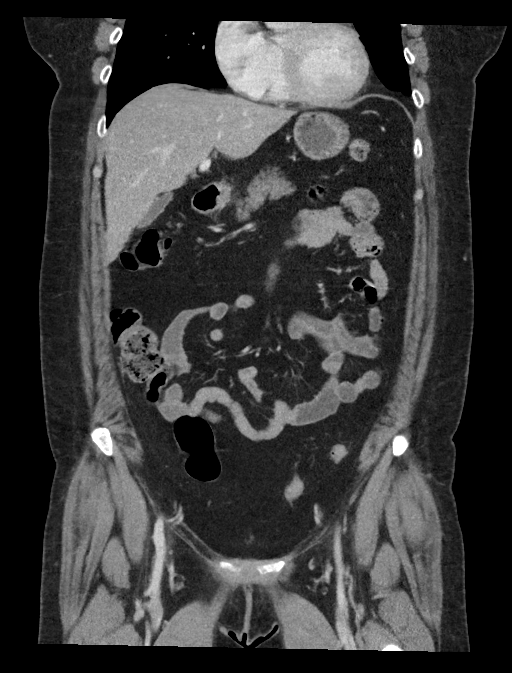
[im 41/92  soft-tissue]
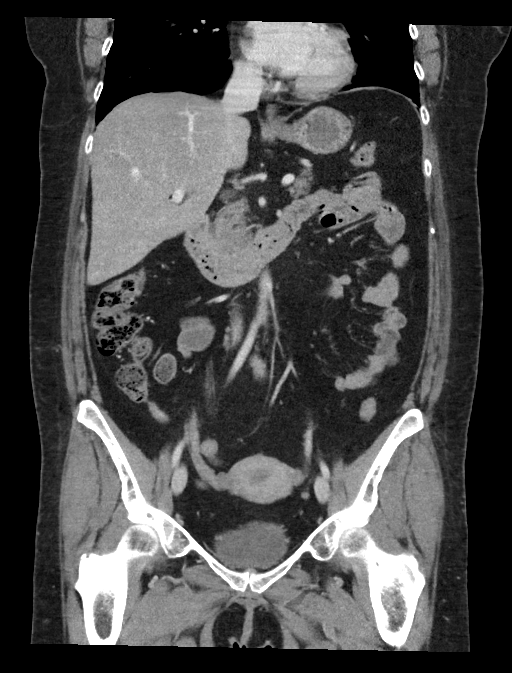
[im 51/92  soft-tissue]
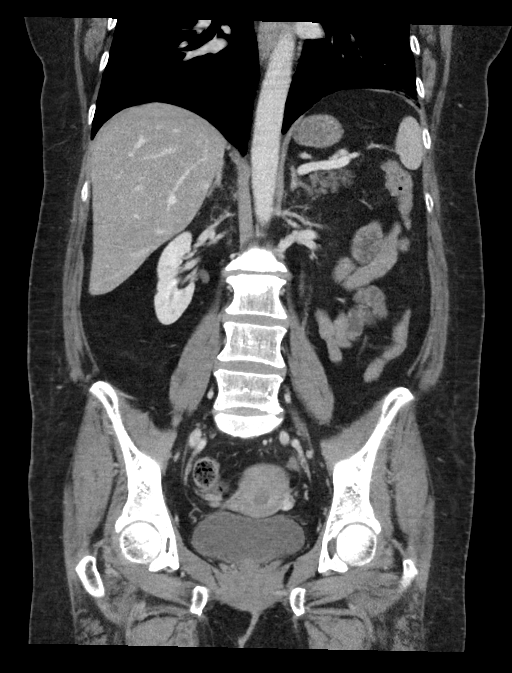

[16 of 46 positions shown; findings below may reference images not displayed]

FINDINGS: Lower chest: No acute abnormality.

Hepatobiliary: No focal liver abnormality is seen. No gallstones,
gallbladder wall thickening, or biliary dilatation.

Pancreas: Unremarkable. No pancreatic ductal dilatation or
surrounding inflammatory changes.

Spleen: Normal in size without focal abnormality.

Adrenals/Urinary Tract: Adrenal glands are unremarkable. Kidneys are
normal, without renal calculi, focal lesion, or hydronephrosis.
Bladder is unremarkable.

Stomach/Bowel: Stomach is within normal limits. Appendix appears
normal. No evidence of bowel wall thickening, distention, or
inflammatory changes.

Vascular/Lymphatic: No significant vascular findings are present. No
enlarged abdominal or pelvic lymph nodes.

Reproductive: Uterus and bilateral adnexa are unremarkable.

Other: No abdominal wall hernia or abnormality. No abdominopelvic
ascites.

Musculoskeletal: No acute or significant osseous findings.
IMPRESSION: No definite abnormality seen in the abdomen or pelvis.

## 2020-11-09 ENCOUNTER — Emergency Department: Payer: Self-pay

## 2020-11-09 ENCOUNTER — Other Ambulatory Visit: Payer: Self-pay

## 2020-11-09 ENCOUNTER — Emergency Department
Admission: EM | Admit: 2020-11-09 | Discharge: 2020-11-09 | Disposition: A | Discharge status: Home / Self Care | Payer: Self-pay | Attending: Emergency Medicine | Admitting: Emergency Medicine

## 2020-11-09 DIAGNOSIS — E119 Type 2 diabetes mellitus without complications: Secondary | ICD-10-CM | POA: Insufficient documentation

## 2020-11-09 DIAGNOSIS — R1084 Generalized abdominal pain: Secondary | ICD-10-CM | POA: Insufficient documentation

## 2020-11-09 DIAGNOSIS — R519 Headache, unspecified: Secondary | ICD-10-CM | POA: Insufficient documentation

## 2020-11-09 DIAGNOSIS — R110 Nausea: Secondary | ICD-10-CM | POA: Insufficient documentation

## 2020-11-09 DIAGNOSIS — R300 Dysuria: Secondary | ICD-10-CM | POA: Insufficient documentation

## 2020-11-09 LAB — COMPREHENSIVE METABOLIC PANEL
ALT: 53 U/L — ABNORMAL HIGH (ref 0–44)
AST: 36 U/L (ref 15–41)
Albumin: 3.6 g/dL (ref 3.5–5.0)
Alkaline Phosphatase: 81 U/L (ref 38–126)
Anion gap: 8 (ref 5–15)
BUN: 18 mg/dL (ref 6–20)
CO2: 27 mmol/L (ref 22–32)
Calcium: 9.2 mg/dL (ref 8.9–10.3)
Chloride: 101 mmol/L (ref 98–111)
Creatinine, Ser: 0.78 mg/dL (ref 0.44–1.00)
GFR, Estimated: >60 mL/min (ref >60)
Glucose, Bld: 179 mg/dL — ABNORMAL HIGH (ref 70–99)
Potassium: 4.1 mmol/L (ref 3.5–5.1)
Sodium: 136 mmol/L (ref 135–145)
Total Bilirubin: 0.7 mg/dL (ref 0.3–1.2)
Total Protein: 7.1 g/dL (ref 6.5–8.1)

## 2020-11-09 LAB — CBC WITH DIFFERENTIAL/PLATELET
Abs Immature Granulocytes: 0.02 10*3/uL (ref 0.00–0.07)
Basophils Absolute: 0.1 10*3/uL (ref 0.0–0.1)
Basophils Relative: 1 %
Eosinophils Absolute: 0.2 10*3/uL (ref 0.0–0.5)
Eosinophils Relative: 3 %
HCT: 35.1 % — ABNORMAL LOW (ref 36.0–46.0)
Hemoglobin: 12.3 g/dL (ref 12.0–15.0)
Immature Granulocytes: 0 %
Lymphocytes Relative: 41 %
Lymphs Abs: 2.8 10*3/uL (ref 0.7–4.0)
MCH: 30.3 pg (ref 26.0–34.0)
MCHC: 35 g/dL (ref 30.0–36.0)
MCV: 86.5 fL (ref 80.0–100.0)
Monocytes Absolute: 0.5 10*3/uL (ref 0.1–1.0)
Monocytes Relative: 7 %
Neutro Abs: 3.3 10*3/uL (ref 1.7–7.7)
Neutrophils Relative %: 48 %
Platelets: 253 10*3/uL (ref 150–400)
RBC: 4.06 MIL/uL (ref 3.87–5.11)
RDW: 12.2 % (ref 11.5–15.5)
Smear Review: NORMAL
WBC: 6.9 10*3/uL (ref 4.0–10.5)
nRBC: 0 % (ref 0.0–0.2)

## 2020-11-09 LAB — URINALYSIS, COMPLETE (UACMP) WITH MICROSCOPIC
Bilirubin Urine: NEGATIVE
Glucose, UA: NEGATIVE mg/dL
Hgb urine dipstick: NEGATIVE
Ketones, ur: NEGATIVE mg/dL
Leukocytes,Ua: NEGATIVE
Nitrite: NEGATIVE
Protein, ur: NEGATIVE mg/dL
Specific Gravity, Urine: 1.02 (ref 1.005–1.030)
pH: 6 (ref 5.0–8.0)

## 2020-11-09 LAB — LIPASE, BLOOD: Lipase: 29 U/L (ref 11–51)

## 2020-11-09 LAB — TROPONIN I (HIGH SENSITIVITY): Troponin I (High Sensitivity): 3 ng/L (ref <18)

## 2020-11-09 MED ORDER — IOHEXOL 350 MG/ML SOLN
80.0000 mL | Freq: Once | INTRAVENOUS | Status: AC | PRN
  Administered 2020-11-09: 80 mL via INTRAVENOUS

## 2020-11-09 MED ORDER — FAMOTIDINE IN NACL 20-0.9 MG/50ML-% IV SOLN
20.0000 mg | Freq: Once | INTRAVENOUS | Status: AC
  Administered 2020-11-09: 20 mg via INTRAVENOUS
  Filled 2020-11-09: qty 50

## 2020-11-09 MED ORDER — SUCRALFATE 1 G PO TABS: 1.0000 g | ORAL_TABLET | Freq: Four times a day (QID) | ORAL | 1 refills | Status: AC | PRN

## 2020-11-09 MED ORDER — SODIUM CHLORIDE 0.9 % IV BOLUS
1000.0000 mL | Freq: Once | INTRAVENOUS | Status: AC
  Administered 2020-11-09: 1000 mL via INTRAVENOUS

## 2020-11-09 MED ORDER — ONDANSETRON 8 MG PO TBDP: 8.0000 mg | ORAL_TABLET | Freq: Three times a day (TID) | ORAL | 0 refills | Status: AC | PRN

## 2020-11-09 MED ORDER — MORPHINE SULFATE (PF) 4 MG/ML IV SOLN
4.0000 mg | Freq: Once | INTRAVENOUS | Status: AC
Start: 2020-11-09 — End: 2020-11-09
  Administered 2020-11-09: 4 mg via INTRAVENOUS
  Filled 2020-11-09: qty 1

## 2020-11-09 MED ORDER — ALUM & MAG HYDROXIDE-SIMETH 200-200-20 MG/5ML PO SUSP
15.0000 mL | Freq: Once | ORAL | Status: AC
  Administered 2020-11-09: 15 mL via ORAL
  Filled 2020-11-09: qty 30

## 2020-11-09 MED ORDER — TRAMADOL HCL 50 MG PO TABS: 50.0000 mg | ORAL_TABLET | Freq: Four times a day (QID) | ORAL | 0 refills | Status: AC | PRN

## 2020-11-09 MED ORDER — DICYCLOMINE HCL 10 MG PO CAPS
10.0000 mg | ORAL_CAPSULE | Freq: Once | ORAL | Status: AC
  Administered 2020-11-09: 10 mg via ORAL
  Filled 2020-11-09: qty 1

## 2020-11-09 MED ORDER — DICYCLOMINE HCL 10 MG PO CAPS: 10.0000 mg | ORAL_CAPSULE | Freq: Three times a day (TID) | ORAL | 0 refills | Status: AC | PRN

## 2020-11-09 MED ORDER — ONDANSETRON HCL 4 MG/2ML IJ SOLN
4.0000 mg | Freq: Once | INTRAMUSCULAR | Status: AC
  Administered 2020-11-09: 4 mg via INTRAVENOUS
  Filled 2020-11-09: qty 2

## 2020-11-09 MED ORDER — PANTOPRAZOLE SODIUM 40 MG PO TBEC: 40.0000 mg | DELAYED_RELEASE_TABLET | Freq: Two times a day (BID) | ORAL | 1 refills | Status: DC

## 2020-11-09 NOTE — Discharge Instructions (Addendum)
Take the Protonix twice daily as prescribed, whether or not you are having pain.  You may take the Carafate up to 4 times daily especially with meals to help with the more heartburn upper abdominal type of pain.  If you have more abdominal cramping or spasm type of pain, take the Bentyl.  The Zofran has been prescribed for nausea.  You may take the tramadol if needed for more severe pain if the other medications do not work, or especially at night.  Do not drive or operate machinery when on this medication.  Return to the ER for new, worsening, or persistent severe pain, vomiting, fever, weakness, or any other new or worsening symptoms that concern you.  Follow-up with your gynecologist tomorrow as planned.  We have also given a referral for gastroenterology follow-up.

## 2020-11-09 NOTE — ED Provider Notes (Signed)
[facility] Emergency Department Provider Note   ____________________________________________   Event Date/Time   First MD Initiated Contact with Patient 11/09/20 [phone]     (approximate)  I have reviewed the triage vital signs and the nursing notes.   HISTORY  Chief Complaint Abdominal Pain    HPI Margaret Olson is a 50 y.o. female who presents to the ED from home with a chief complaint of abdominal pain, nausea, headache and dysuria.  Symptoms x2 weeks.  Patient complains of generalized abdominal cramping associated with nausea, no vomiting.  Mild, generalized headache without vision changes and dysuria.  History of H. pylori as well as UTI and states it feels similarly.  Denies fever, chills, neck pain, cough, chest pain, shortness of breath, vomiting, diarrhea.  Denies recent travel, trauma or antibiotic use.      Past Medical History:  Diagnosis Date   Diabetes mellitus without complication [facility])     Patient Active Problem List   Diagnosis Date Noted   Type 2 diabetes mellitus without complication, without long-term current use of insulin (HCC) 03/26/2018   Amenorrhea 03/26/2018   Chronic bilateral low back pain without sciatica 03/26/2018   H. pylori infection 03/26/2018    Past Surgical History:  Procedure Laterality Date   ENDOMETRIAL BIOPSY     TUBAL LIGATION      Prior to Admission medications   Medication Sig Start Date End Date Taking? Authorizing Provider  clotrimazole (LOTRIMIN) 1 % cream Apply 1 application topically 2 (two) times daily. Apply as needed 09/23/19   Jamelle Haring, MD  ibuprofen (ADVIL) 600 MG tablet Take 1 tablet (600 mg total) by mouth every 8 (eight) hours as needed. 03/14/20   Joni Reining, PA-C  ondansetron (ZOFRAN ODT) 4 MG disintegrating tablet Take 1 tablet (4 mg total) by mouth every 8 (eight) hours as needed for nausea or vomiting. 11/24/19   Lucy Chris, PA  ondansetron (ZOFRAN) 8 MG  tablet Take 1 tablet (8 mg total) by mouth every 8 (eight) hours as needed for nausea or vomiting. 03/14/20   Joni Reining, PA-C  oxyCODONE-acetaminophen (PERCOCET) 7.5-325 MG tablet Take 1 tablet by mouth every 4 (four) hours as needed for severe pain. 03/14/20   Joni Reining, PA-C    Allergies Patient has no known allergies.  Family History  Problem Relation Age of Onset   Hypertension Father    Diabetes Maternal Grandfather    Cancer Maternal Grandfather    Heart attack Paternal Grandmother    Breast cancer Paternal Grandmother     Social History Social History   Tobacco Use   Smoking status: Never   Smokeless tobacco: Never  Vaping Use   Vaping Use: Never used  Substance Use Topics   Alcohol use: Yes    Comment: occ   Drug use: No    Review of Systems  Constitutional: No fever/chills Eyes: No visual changes. ENT: No sore throat. Cardiovascular: Denies chest pain. Respiratory: Denies shortness of breath. Gastrointestinal: Positive for abdominal pain and nausea, no vomiting.  No diarrhea.  No constipation. Genitourinary: Positive for dysuria. Musculoskeletal: Negative for back pain. Skin: Negative for rash. Neurological: Negative for headaches, focal weakness or numbness.   ____________________________________________   PHYSICAL EXAM:  VITAL SIGNS: ED Triage Vitals  Enc Vitals Group     BP 11/09/20 0522 (!) 164/99     Pulse Rate 11/09/20 0522 66     Resp 11/09/20 0522 20  Temp 11/09/20 0525 97.6 F (36.4 C)     Temp Source 11/09/20 0525 Oral     SpO2 11/09/20 0522 96 %     Weight 11/09/20 0521 190 lb (86.2 kg)     Height 11/09/20 0521 5\' 8"  (1.727 m)     Head Circumference --      Peak Flow --      Pain Score 11/09/20 0520 6     Pain Loc --      Pain Edu? --      Excl. in GC? --     Constitutional: Alert and oriented. Well appearing and in mild acute distress. Eyes: Conjunctivae are normal. PERRL. EOMI. Head: Atraumatic. Nose: No  congestion/rhinnorhea. Mouth/Throat: Mucous membranes are moist.   Neck: No stridor.   Cardiovascular: Normal rate, regular rhythm. Grossly normal heart sounds.  Good peripheral circulation. Respiratory: Normal respiratory effort.  No retractions. Lungs CTAB. Gastrointestinal: Soft with mild diffuse tenderness to palpation, maximally in suprapubic region without rebound or guarding. No distention. No abdominal bruits. No CVA tenderness. Musculoskeletal: No lower extremity tenderness nor edema.  No joint effusions. Neurologic:  Normal speech and language. No gross focal neurologic deficits are appreciated. No gait instability. Skin:  Skin is warm, dry and intact. No rash noted. Psychiatric: Mood and affect are normal. Speech and behavior are normal.  ____________________________________________   LABS (all labs ordered are listed, but only abnormal results are displayed)  Labs Reviewed  CBC WITH DIFFERENTIAL/PLATELET - Abnormal; Notable for the following components:      Result Value   HCT 35.1 (*)    All other components within normal limits  COMPREHENSIVE METABOLIC PANEL - Abnormal; Notable for the following components:   Glucose, Bld 179 (*)    ALT 53 (*)    All other components within normal limits  URINALYSIS, COMPLETE (UACMP) WITH MICROSCOPIC - Abnormal; Notable for the following components:   Color, Urine YELLOW (*)    APPearance CLEAR (*)    Bacteria, UA RARE (*)    All other components within normal limits  LIPASE, BLOOD  TROPONIN I (HIGH SENSITIVITY)   ____________________________________________  EKG  ED ECG REPORT I, Lachell Rochette J, the attending physician, personally viewed and interpreted this ECG.   Date: 11/09/2020  EKG Time: 0625  Rate: 65  Rhythm: normal sinus rhythm  Axis: Normal  Intervals:none  ST&T Change: Nonspecific  ____________________________________________  RADIOLOGY I, Jadee Golebiewski J, personally viewed and evaluated these images (plain  radiographs) as part of my medical decision making, as well as reviewing the written report by the radiologist.  ED MD interpretation: Pending  Official radiology report(s): No results found.  ____________________________________________   PROCEDURES  Procedure(s) performed (including Critical Care):  Procedures   ____________________________________________   INITIAL IMPRESSION / ASSESSMENT AND PLAN / ED COURSE  As part of my medical decision making, I reviewed the following data within the electronic MEDICAL RECORD NUMBER Nursing notes reviewed and incorporated, Labs reviewed, EKG interpreted, Old chart reviewed, Radiograph reviewed, and Notes from prior ED visits     50 year old female presenting with abdominal pain and nausea. Differential diagnosis includes, but is not limited to, ovarian cyst, ovarian torsion, acute appendicitis, diverticulitis, urinary tract infection/pyelonephritis, endometriosis, bowel obstruction, colitis, renal colic, gastroenteritis, hernia, fibroids, endometriosis, pregnancy related pain including ectopic pregnancy, etc.   Will obtain abdominal lab work, UA, CT abdomen/pelvis.  Administer IV analgesia with antiemetic, add IV Pepcid and fluids.  Will reassess.  Clinical Course as of 11/09/20 01/09/21  Wed Nov 09, 2020  0640 Improved after morphine.  Awaiting CT scan. [JS]  [id number] Care transferred to Dr. Marisa Severin at change of shift pending troponin and CT scan. [JS]    Clinical Course User Index [JS] Irean Hong, MD     ____________________________________________   FINAL CLINICAL IMPRESSION(S) / ED DIAGNOSES  Final diagnoses:  Generalized abdominal pain     ED Discharge Orders     None        Note:  This document was prepared using Dragon voice recognition software and may include unintentional dictation errors.    Irean Hong, MD 11/09/20 [phone]

## 2020-11-09 NOTE — ED Triage Notes (Signed)
Pt in with generalized abd cramping, nausea and headache for 2 weeks. States hx of H. Pylori states feels the same, also thinks she may have UTI.

## 2020-11-09 NOTE — ED Provider Notes (Signed)
-----------------------------------------   8:59 AM on 11/09/2020 -----------------------------------------  I took over care of this patient from Dr. Dolores Frame.  CT abdomen shows no acute abnormality, although there is a thickened endometrial stripe similar to her prior ultrasound.  I informed the patient of these findings.  Troponin is negative.  On reassessment, the patient appears more comfortable and is tolerating p.o.  At this time, she is stable for discharge home.  I overall suspect most likely gastritis as the primary etiology of her upper abdominal pain.  She is not on any medications for this currently.  I have prescribed her Protonix, Carafate, Zofran, Bentyl, and a small quantity of tramadol for any breakthrough pain.  She is following up with her gynecologist tomorrow and I have given her a gastroenterology referral.  I counseled the patient extensively on the results of the work-up and answered all of her questions.  Return precautions given, and she expresses understanding.   Dionne Bucy, MD 11/09/20 0900

## 2021-12-22 IMAGING — CT CT ABD-PELV W/ CM
2 of 5 series · 16 of 46 positions shown, 18 images · IV contrast (APPLIED)
Comparison: 03/26/2019

CLINICAL DATA: Acute, nonlocalized abdominal pain. Abdominal
cramping with nausea and headache for 2 weeks

EXAM:
CT ABDOMEN AND PELVIS WITH CONTRAST
TECHNIQUE: Multidetector CT imaging of the abdomen and pelvis was performed
using the standard protocol following bolus administration of
intravenous contrast.
CONTRAST:  80mL OMNIPAQUE IOHEXOL 350 MG/ML SOLN

[Series 2: routine abd/pel with · axial · 0.88mm/px · z∈[-1162,-712]mm · 13 of 102 slices shown, 15 images]
[im 6/102  soft-tissue]
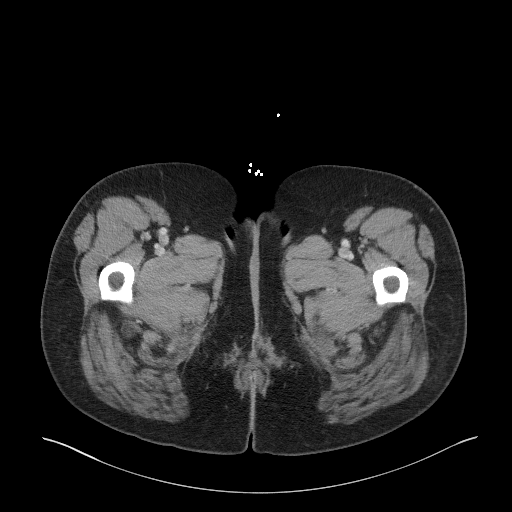
[im 6/102  bone]
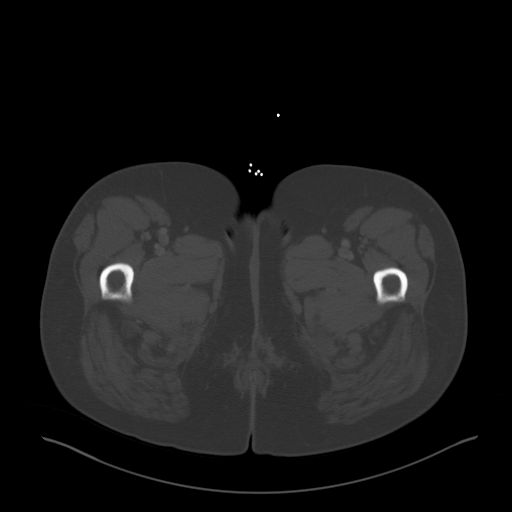
[im 16/102  soft-tissue]
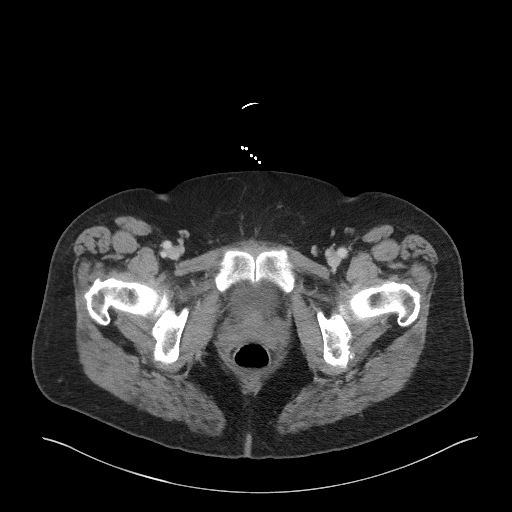
[im 22/102  soft-tissue]
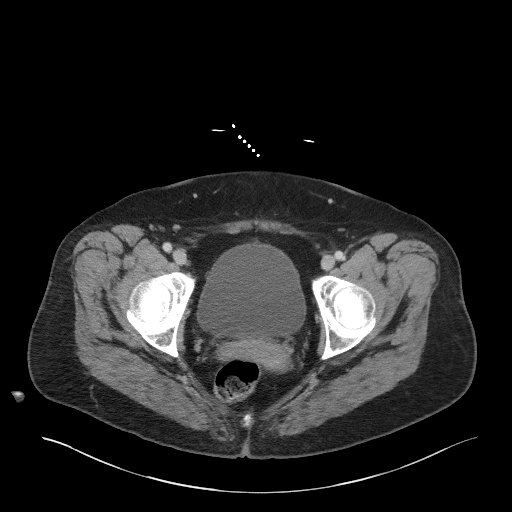
[im 27/102  soft-tissue]
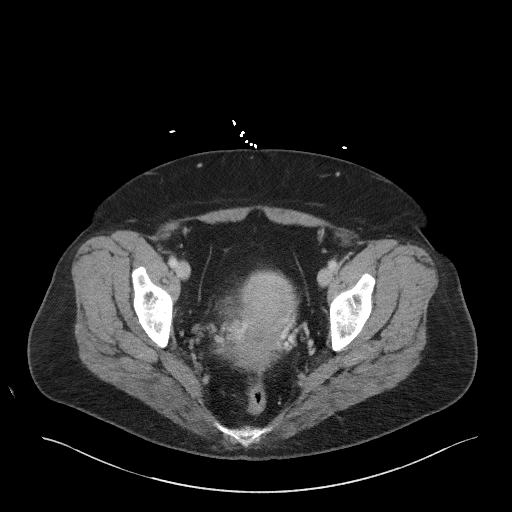
[im 38/102  soft-tissue]
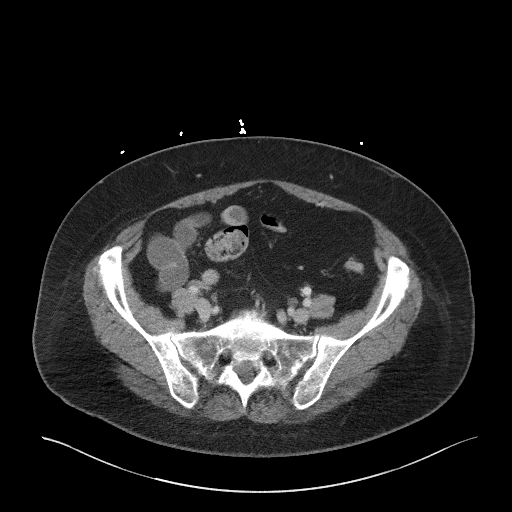
[im 43/102  soft-tissue]
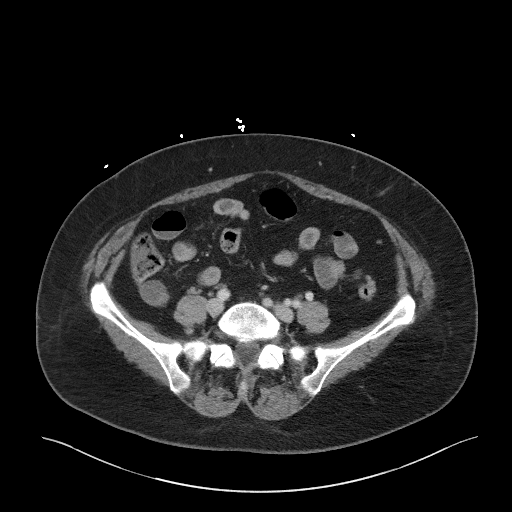
[im 54/102  soft-tissue]
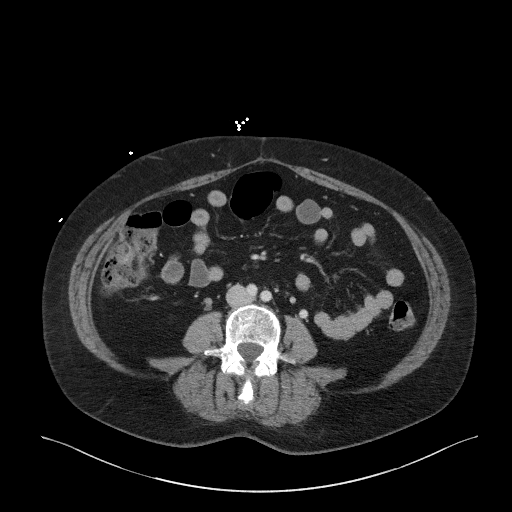
[im 59/102  soft-tissue]
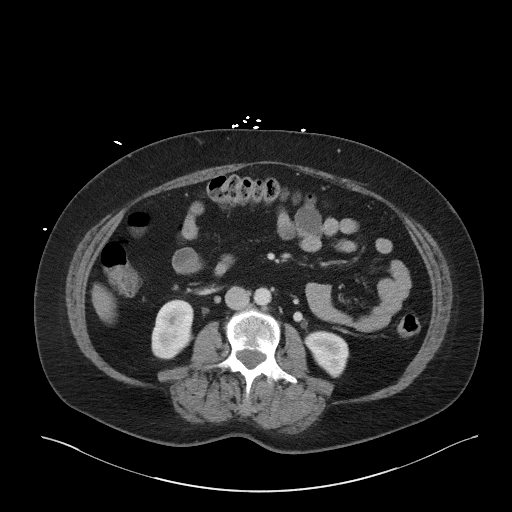
[im 64/102  soft-tissue]
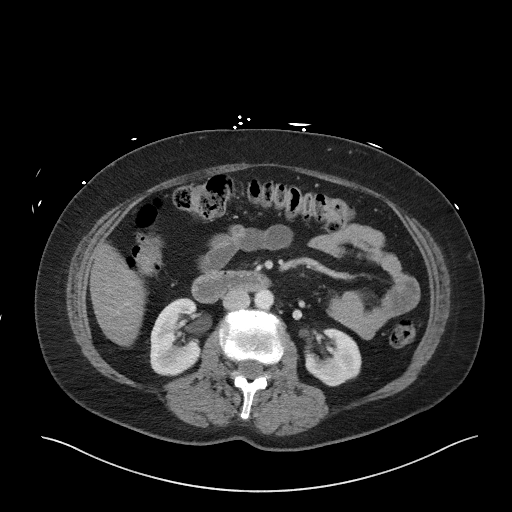
[im 64/102  bone]
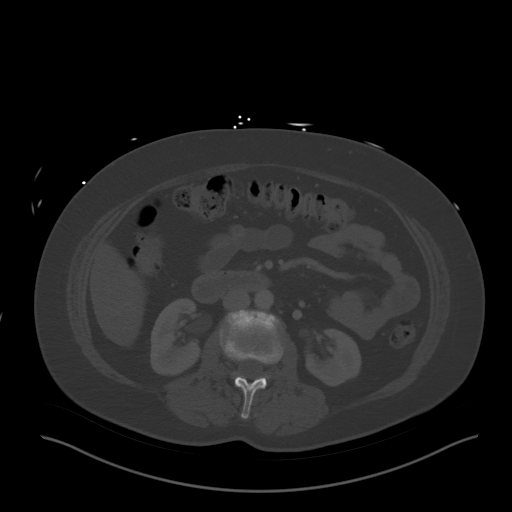
[im 75/102  soft-tissue]
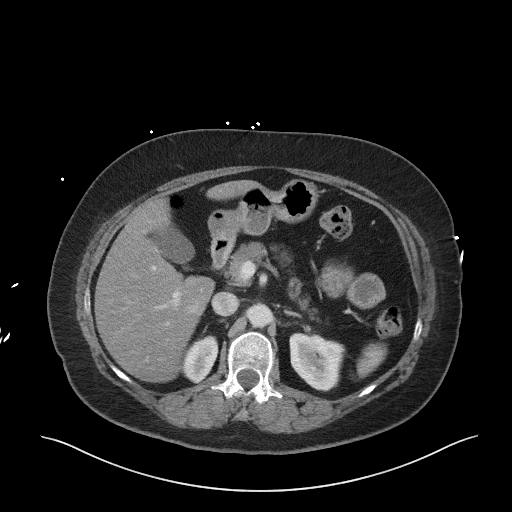
[im 80/102  soft-tissue]
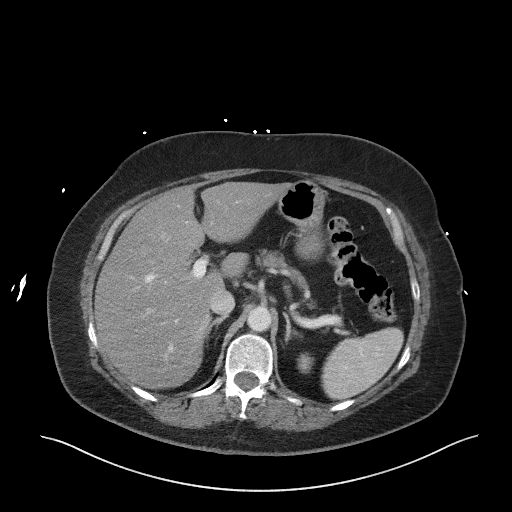
[im 86/102  soft-tissue]
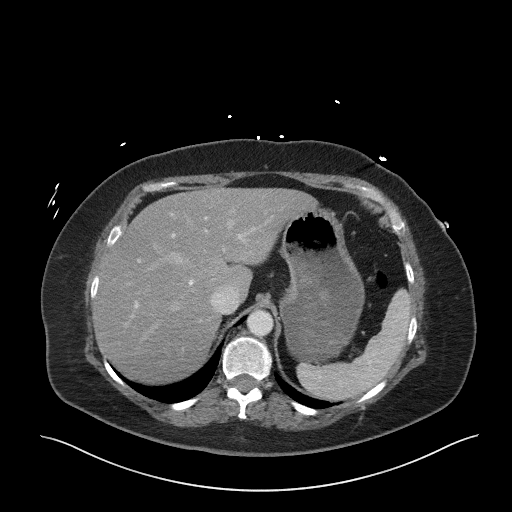
[im 96/102  soft-tissue]
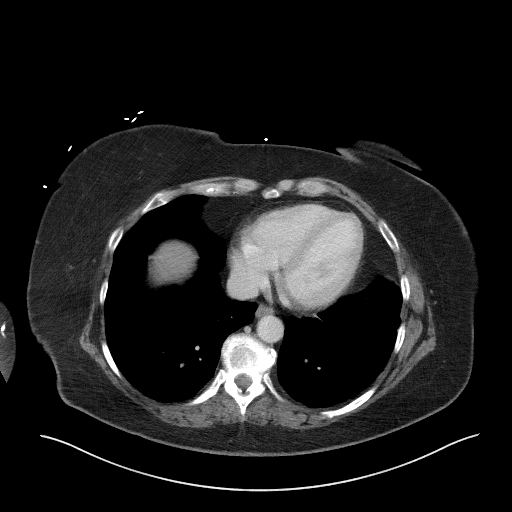

[Series 4: coronal st · coronal · 0.82mm/px · 3 of 103 slices shown]
[im 35/103  soft-tissue]
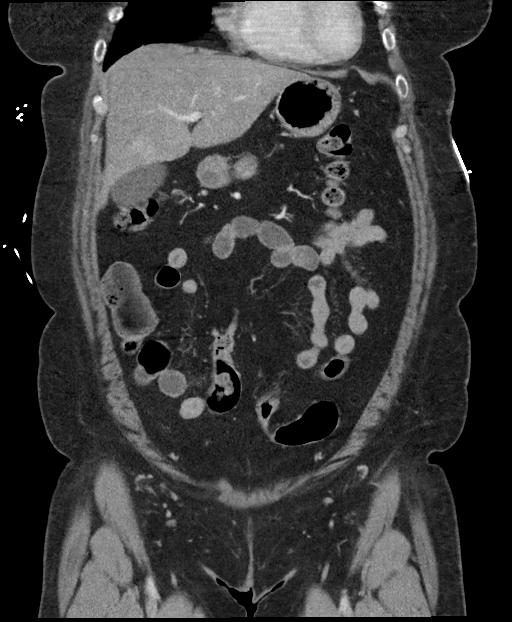
[im 46/103  soft-tissue]
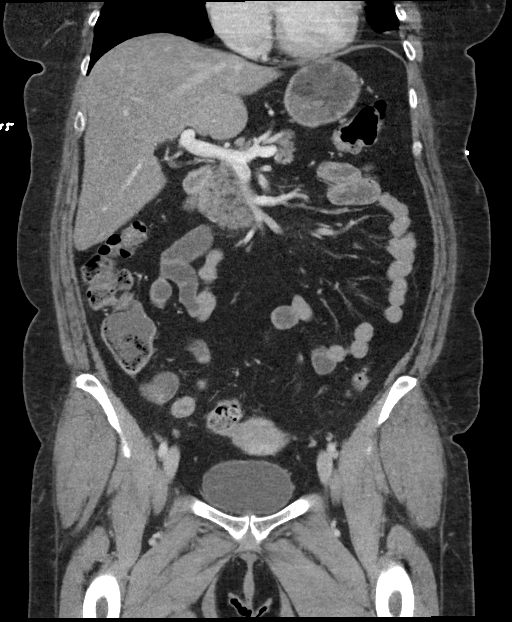
[im 57/103  soft-tissue]
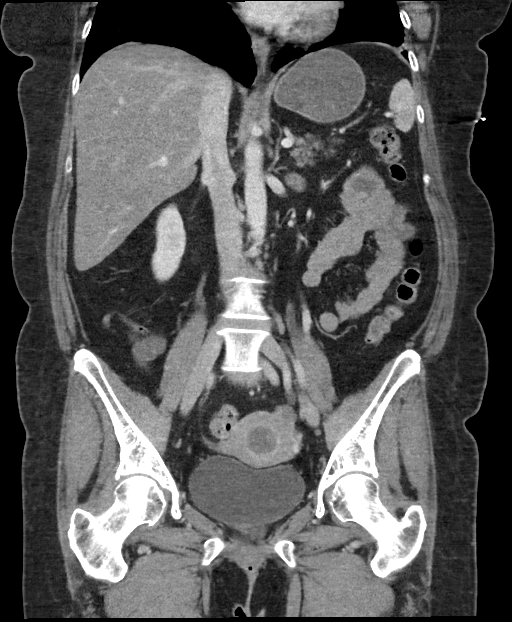

[16 of 46 positions shown; findings below may reference images not displayed]

FINDINGS: Lower chest:  No contributory findings.

Hepatobiliary: Hepatic steatosis.No evidence of biliary obstruction
or stone.

Pancreas: Unremarkable.

Spleen: Unremarkable.

Adrenals/Urinary Tract: Negative adrenals. No hydronephrosis or
stone. Unremarkable bladder.

Stomach/Bowel:  No obstruction. No appendicitis.

Vascular/Lymphatic: No acute vascular abnormality. No mass or
adenopathy.

Reproductive:Prominent thickness of the endometrium at 22 mm with
mild heterogeneity. Negative adnexa

Other: No ascites or pneumoperitoneum.

Musculoskeletal: No acute abnormalities. Disc narrowing with ventral
spurring at L2-3.
IMPRESSION: 1. No acute finding.
2. Prominent endometrial thickness (22 mm), dimensions stable from
03/26/2019 pelvic ultrasound. There is chart history of endometrial
biopsy, please correlate with timing and results.
3. Hepatic steatosis.

## 2022-06-11 ENCOUNTER — Other Ambulatory Visit: Payer: Self-pay

## 2022-06-11 DIAGNOSIS — E119 Type 2 diabetes mellitus without complications: Secondary | ICD-10-CM | POA: Insufficient documentation

## 2022-06-11 DIAGNOSIS — R110 Nausea: Secondary | ICD-10-CM | POA: Insufficient documentation

## 2022-06-11 DIAGNOSIS — Z5321 Procedure and treatment not carried out due to patient leaving prior to being seen by health care provider: Secondary | ICD-10-CM | POA: Insufficient documentation

## 2022-06-11 DIAGNOSIS — R1084 Generalized abdominal pain: Secondary | ICD-10-CM | POA: Insufficient documentation

## 2022-06-11 LAB — URINALYSIS, ROUTINE W REFLEX MICROSCOPIC
Bilirubin Urine: NEGATIVE
Glucose, UA: NEGATIVE mg/dL
Ketones, ur: NEGATIVE mg/dL
Nitrite: NEGATIVE
Protein, ur: NEGATIVE mg/dL
Specific Gravity, Urine: 1.025 (ref 1.005–1.030)
pH: 5.5 (ref 5.0–8.0)

## 2022-06-11 LAB — COMPREHENSIVE METABOLIC PANEL
ALT: 20 U/L (ref 0–44)
AST: 20 U/L (ref 15–41)
Albumin: 4.2 g/dL (ref 3.5–5.0)
Alkaline Phosphatase: 74 U/L (ref 38–126)
Anion gap: 10 (ref 5–15)
BUN: 18 mg/dL (ref 6–20)
CO2: 24 mmol/L (ref 22–32)
Calcium: 9.2 mg/dL (ref 8.9–10.3)
Chloride: 103 mmol/L (ref 98–111)
Creatinine, Ser: 0.6 mg/dL (ref 0.44–1.00)
GFR, Estimated: >60 mL/min (ref >60)
Glucose, Bld: 91 mg/dL (ref 70–99)
Potassium: 3.9 mmol/L (ref 3.5–5.1)
Sodium: 137 mmol/L (ref 135–145)
Total Bilirubin: 0.7 mg/dL (ref 0.3–1.2)
Total Protein: 7.9 g/dL (ref 6.5–8.1)

## 2022-06-11 LAB — CBC
HCT: 41.8 % (ref 36.0–46.0)
Hemoglobin: 13.7 g/dL (ref 12.0–15.0)
MCH: 29 pg (ref 26.0–34.0)
MCHC: 32.8 g/dL (ref 30.0–36.0)
MCV: 88.6 fL (ref 80.0–100.0)
Platelets: 303 10*3/uL (ref 150–400)
RBC: 4.72 MIL/uL (ref 3.87–5.11)
RDW: 11.5 % (ref 11.5–15.5)
WBC: 11.3 10*3/uL — ABNORMAL HIGH (ref 4.0–10.5)
nRBC: 0 % (ref 0.0–0.2)

## 2022-06-11 LAB — LIPASE, BLOOD: Lipase: 28 U/L (ref 11–51)

## 2022-06-11 NOTE — ED Triage Notes (Signed)
Pt to ED for generalized abd pain, h/a, nausea started 3 weeks ago.

## 2022-06-11 NOTE — ED Provider Triage Note (Signed)
Emergency Medicine Provider Triage Evaluation Note  Margaret Olson, a 52 y.o. female  was evaluated in triage.  Pt complains of abdominal discomfort, intermittent headaches, intermittent nausea for the last 3 weeks.  Patient denies any significant bladder or bowel changes patient does have a history of diabetes and H. pylori infection.   Review of Systems  Positive: Abd pain Negative: FCS  Physical Exam  BP (!) 140/84   Pulse 77   Temp 97.8 F (36.6 C)   Resp 18   Ht 5\' 8"  (1.727 m)   Wt 79.4 kg   SpO2 100%   BMI 26.61 kg/m  Gen:   Awake, no distress  NAD Resp:  Normal effort CTA MSK:   Moves extremities without difficulty  Other:  Soft, nontender  Medical Decision Making  Medically screening exam initiated at 7:17 PM.  Appropriate orders placed.  Nada Maclachlan was informed that the remainder of the evaluation will be completed by another provider, this initial triage assessment does not replace that evaluation, and the importance of remaining in the ED until their evaluation is complete.  Patient to the ED for evaluation of 3 weeks of intermittent abdominal discomfort, headache, with nausea.  Denies any significant bowel changes.   Lissa Hoard, PA-C 06/11/22 1918

## 2022-06-12 ENCOUNTER — Emergency Department
Admission: EM | Admit: 2022-06-12 | Discharge: 2022-06-12 | Disposition: A | Discharge status: Home / Self Care | Payer: Self-pay | Attending: Emergency Medicine | Admitting: Emergency Medicine

## 2022-06-12 ENCOUNTER — Emergency Department
Admission: EM | Admit: 2022-06-12 | Discharge: 2022-06-12 | Discharge status: Against Medical Advice | Payer: Self-pay | Attending: Emergency Medicine | Admitting: Emergency Medicine

## 2022-06-12 ENCOUNTER — Other Ambulatory Visit: Payer: Self-pay

## 2022-06-12 DIAGNOSIS — R1084 Generalized abdominal pain: Secondary | ICD-10-CM | POA: Insufficient documentation

## 2022-06-12 DIAGNOSIS — R350 Frequency of micturition: Secondary | ICD-10-CM | POA: Insufficient documentation

## 2022-06-12 MED ORDER — CEPHALEXIN 500 MG PO CAPS: 500.0000 mg | ORAL_CAPSULE | Freq: Two times a day (BID) | ORAL | 0 refills | Status: DC

## 2022-06-12 MED ORDER — CEPHALEXIN 500 MG PO CAPS
500.0000 mg | ORAL_CAPSULE | Freq: Once | ORAL | Status: AC
  Administered 2022-06-12: 500 mg via ORAL
  Filled 2022-06-12: qty 1

## 2022-06-12 NOTE — ED Provider Notes (Signed)
   [facility] Provider Note    Event Date/Time   First MD Initiated Contact with Patient 06/12/22 1210     (approximate)   History   Abdominal Pain   HPI  Margaret Olson is a 52 y.o. female who presents with complaints of periumbilical abdominal discomfort for several weeks.  She does report urinary frequency as well.  No fevers or chills, no diarrhea, no vomiting.  No history of abdominal surgery.     Physical Exam   Triage Vital Signs: ED Triage Vitals [06/12/22 1148]  Enc Vitals Group     BP 126/81     Pulse Rate 75     Resp 16     Temp 97.8 F (36.6 C)     Temp Source Oral     SpO2 97 %     Weight 79.4 kg (175 lb 0.7 oz)     Height 1.727 m (5\' 8" )     Head Circumference      Peak Flow      Pain Score 5     Pain Loc      Pain Edu?      Excl. in GC?     Most recent vital signs: Vitals:   06/12/22 1148  BP: 126/81  Pulse: 75  Resp: 16  Temp: 97.8 F (36.6 C)  SpO2: 97%     General: Awake, no distress.  CV:  Good peripheral perfusion.  Resp:  Normal effort.  Abd:  No distention.  Soft, nontender, reassuring exam Other:     ED Results / Procedures / Treatments   Labs (all labs ordered are listed, but only abnormal results are displayed) Labs Reviewed - No data to display   EKG     RADIOLOGY     PROCEDURES:  Critical Care performed:   Procedures   MEDICATIONS ORDERED IN ED: Medications  cephALEXin (KEFLEX) capsule 500 mg (has no administration in time range)     IMPRESSION / MDM / ASSESSMENT AND PLAN / ED COURSE  I reviewed the triage vital signs and the nursing notes. Patient's presentation is most consistent with acute illness / injury with system symptoms.   Reviewed labs performed yesterday, mild elevation of white blood cell count is nonspecific, urinalysis questionable contamination but could be related to UTI.  Given her frequency vague discomfort in the lower abdomen suspect urinary tract  infection, will trial Keflex, she will follow-up if no improvement.       FINAL CLINICAL IMPRESSION(S) / ED DIAGNOSES   Final diagnoses:  Generalized abdominal pain     Rx / DC Orders   ED Discharge Orders          Ordered    cephALEXin (KEFLEX) 500 MG capsule  2 times daily        06/12/22 1228             Note:  This document was prepared using Dragon voice recognition software and may include unintentional dictation errors.   Jene Every, MD 06/12/22 [phone]

## 2022-06-12 NOTE — ED Notes (Signed)
No answer when called several times from lobby 

## 2022-06-12 NOTE — ED Triage Notes (Signed)
Pt here with abd pain. Pt states she was here last night for same but the wait was too long. Pt is still having pain.

## 2022-09-27 ENCOUNTER — Emergency Department
Admission: EM | Admit: 2022-09-27 | Discharge: 2022-09-27 | Disposition: A | Discharge status: Home / Self Care | Payer: Self-pay | Attending: Emergency Medicine | Admitting: Emergency Medicine

## 2022-09-27 ENCOUNTER — Other Ambulatory Visit: Payer: Self-pay

## 2022-09-27 DIAGNOSIS — J029 Acute pharyngitis, unspecified: Secondary | ICD-10-CM | POA: Insufficient documentation

## 2022-09-27 DIAGNOSIS — E119 Type 2 diabetes mellitus without complications: Secondary | ICD-10-CM | POA: Insufficient documentation

## 2022-09-27 DIAGNOSIS — Z1152 Encounter for screening for COVID-19: Secondary | ICD-10-CM | POA: Insufficient documentation

## 2022-09-27 LAB — GROUP A STREP BY PCR: Group A Strep by PCR: NOT DETECTED

## 2022-09-27 LAB — SARS CORONAVIRUS 2 BY RT PCR: SARS Coronavirus 2 by RT PCR: NEGATIVE

## 2022-09-27 MED ORDER — KETOROLAC TROMETHAMINE 30 MG/ML IJ SOLN
30.0000 mg | Freq: Once | INTRAMUSCULAR | Status: AC
  Administered 2022-09-27: 30 mg via INTRAMUSCULAR
  Filled 2022-09-27: qty 1

## 2022-09-27 MED ORDER — DEXAMETHASONE 10 MG/ML FOR PEDIATRIC ORAL USE
10.0000 mg | Freq: Once | INTRAMUSCULAR | Status: AC
  Administered 2022-09-27: 10 mg via ORAL
  Filled 2022-09-27: qty 1

## 2022-09-27 NOTE — ED Triage Notes (Signed)
Pt comes with c/o sore throat headache and fatigue since Tuesday night.

## 2022-09-27 NOTE — ED Notes (Signed)
See triage notes. Patient c/o headache, sore throat and fatigue since Tuesday night.

## 2022-09-27 NOTE — ED Provider Notes (Signed)
[facility] Provider Note    Event Date/Time   First MD Initiated Contact with Patient 09/27/22 1104     (approximate)   History   Chief Complaint Sore Throat   HPI  Margaret Olson is a 52 y.o. female with past medical history of diabetes and chronic back pain who presents to the ED complaining of sore throat.  Patient reports that she has had 3 days of increasing discomfort in the back of her throat including pain when swallowing.  She has not had any fevers or cough, denies difficulty breathing.  She also denies any nausea or vomiting, has been drinking fluids without difficulty.     Physical Exam   Triage Vital Signs: ED Triage Vitals  Encounter Vitals Group     BP 09/27/22 0945 114/69     Systolic BP Percentile --      Diastolic BP Percentile --      Pulse Rate 09/27/22 0945 64     Resp 09/27/22 0945 18     Temp 09/27/22 0945 97.7 F (36.5 C)     Temp src --      SpO2 09/27/22 0945 95 %     Weight 09/27/22 1000 175 lb 0.7 oz (79.4 kg)     Height 09/27/22 1000 5\' 8"  (1.727 m)     Head Circumference --      Peak Flow --      Pain Score 09/27/22 0944 8     Pain Loc --      Pain Education --      Exclude from Growth Chart --     Most recent vital signs: Vitals:   09/27/22 0945  BP: 114/69  Pulse: 64  Resp: 18  Temp: 97.7 F (36.5 C)  SpO2: 95%    Constitutional: Alert and oriented. Eyes: Conjunctivae are normal. Head: Atraumatic. Nose: No congestion/rhinnorhea. Mouth/Throat: Mucous membranes are moist.  Posterior oropharynx with erythema, no edema or exudates noted. Cardiovascular: Normal rate, regular rhythm. Grossly normal heart sounds.  2+ radial pulses bilaterally. Respiratory: Normal respiratory effort.  No retractions. Lungs CTAB. Gastrointestinal: Soft and nontender. No distention. Musculoskeletal: No lower extremity tenderness nor edema.  Neurologic:  Normal speech and language. No gross focal neurologic deficits are  appreciated.    ED Results / Procedures / Treatments   Labs (all labs ordered are listed, but only abnormal results are displayed) Labs Reviewed  SARS CORONAVIRUS 2 BY RT PCR  GROUP A STREP BY PCR    PROCEDURES:  Critical Care performed: No  Procedures   MEDICATIONS ORDERED IN ED: Medications  ketorolac (TORADOL) 30 MG/ML injection 30 mg (has no administration in time range)  dexamethasone (DECADRON) 10 MG/ML injection for Pediatric ORAL use 10 mg (has no administration in time range)     IMPRESSION / MDM / ASSESSMENT AND PLAN / ED COURSE  I reviewed the triage vital signs and the nursing notes.                              53 y.o. female with past medical history of diabetes and chronic back pain who presents to the ED with 3 days of worsening sore throat.  Patient's presentation is most consistent with acute complicated illness / injury requiring diagnostic workup.  Differential diagnosis includes, but is not limited to, strep pharyngitis, viral pharyngitis, peritonsillar abscess.  Patient nontoxic-appearing and in no acute distress, vital signs are unremarkable.  Posterior oropharynx without concerning findings, strep and COVID testing is negative.  We will treat symptomatically with oral Decadron and IM Toradol, patient appropriate for outpatient management of suspected viral pharyngitis.  She was counseled to return to the ED for new or worsening symptoms, patient agrees to plan.      FINAL CLINICAL IMPRESSION(S) / ED DIAGNOSES   Final diagnoses:  Viral pharyngitis     Rx / DC Orders   ED Discharge Orders     None        Note:  This document was prepared using Dragon voice recognition software and may include unintentional dictation errors.   Chesley Noon, MD 09/27/22 1145
